# Patient Record
Sex: Female | Born: 1950 | Race: White | Hispanic: Yes | Marital: Married | Smoking: Never smoker
Health system: Southern US, Community
[De-identification: ages and names within clinical notes are randomized; demographics above are authoritative.]

## PROBLEM LIST (undated history)

## (undated) DIAGNOSIS — M1990 Unspecified osteoarthritis, unspecified site: Secondary | ICD-10-CM

## (undated) DIAGNOSIS — M8580 Other specified disorders of bone density and structure, unspecified site: Secondary | ICD-10-CM

## (undated) DIAGNOSIS — M810 Age-related osteoporosis without current pathological fracture: Secondary | ICD-10-CM

## (undated) DIAGNOSIS — L853 Xerosis cutis: Secondary | ICD-10-CM

## (undated) DIAGNOSIS — E559 Vitamin D deficiency, unspecified: Secondary | ICD-10-CM

## (undated) HISTORY — DX: Age-related osteoporosis without current pathological fracture: M81.0

## (undated) HISTORY — DX: Vitamin D deficiency, unspecified: E55.9

## (undated) HISTORY — DX: Xerosis cutis: L85.3

## (undated) HISTORY — DX: Unspecified osteoarthritis, unspecified site: M19.90

## (undated) HISTORY — DX: Other specified disorders of bone density and structure, unspecified site: M85.80

---

## 2001-07-12 ENCOUNTER — Other Ambulatory Visit: Admission: RE | Admit: 2001-07-12 | Discharge: 2001-07-12 | Payer: Self-pay | Admitting: *Deleted

## 2002-08-07 ENCOUNTER — Other Ambulatory Visit: Admission: RE | Admit: 2002-08-07 | Discharge: 2002-08-07 | Payer: Self-pay | Admitting: *Deleted

## 2004-08-27 ENCOUNTER — Ambulatory Visit (HOSPITAL_COMMUNITY): Admission: RE | Admit: 2004-08-27 | Discharge: 2004-08-27 | Payer: Self-pay | Admitting: Gastroenterology

## 2005-02-01 ENCOUNTER — Encounter: Admission: RE | Admit: 2005-02-01 | Discharge: 2005-02-01 | Payer: Self-pay | Admitting: General Surgery

## 2005-02-04 ENCOUNTER — Ambulatory Visit (HOSPITAL_BASED_OUTPATIENT_CLINIC_OR_DEPARTMENT_OTHER): Admission: RE | Admit: 2005-02-04 | Discharge: 2005-02-04 | Payer: Self-pay | Admitting: General Surgery

## 2005-02-04 ENCOUNTER — Ambulatory Visit (HOSPITAL_COMMUNITY): Admission: RE | Admit: 2005-02-04 | Discharge: 2005-02-04 | Payer: Self-pay | Admitting: General Surgery

## 2012-12-20 ENCOUNTER — Encounter: Payer: Self-pay | Admitting: *Deleted

## 2012-12-20 ENCOUNTER — Encounter: Payer: Self-pay | Admitting: Cardiovascular Disease

## 2012-12-21 ENCOUNTER — Ambulatory Visit (INDEPENDENT_AMBULATORY_CARE_PROVIDER_SITE_OTHER): Payer: Self-pay | Admitting: Cardiovascular Disease

## 2012-12-21 ENCOUNTER — Encounter: Payer: Self-pay | Admitting: Cardiovascular Disease

## 2012-12-21 VITALS — BP 130/80 | HR 62 | Ht 61.0 in | Wt 128.4 lb

## 2012-12-21 DIAGNOSIS — R079 Chest pain, unspecified: Secondary | ICD-10-CM

## 2012-12-21 NOTE — Addendum Note (Signed)
Addended by: Scherrie Bateman E on: 12/21/2012 10:35 AM   Modules accepted: Orders

## 2012-12-21 NOTE — Progress Notes (Signed)
Patient ID: Nicole Petty, female   DOB: 01-May-1951, 62 y.o.   MRN: [id number] 62 yo referred by Clement Sayres for chest pain  Monday had pain in chest At rest and worse in recumbancy.  Lasted until she fell asleep and lingered on awakening.  Has some palpitations with exertion.  Says she has heart aches with walking/running for 20 years.  Also gets sharp pain in chest out of blue.  No pleurisy.  No fever or recent trauma.  Took a baby aspirin on Monday when pain bad but hard to know if this helped.  No cough or sputum.  No history of arthritis, or muscular disease.    ROS: Denies fever, malais, weight loss, blurry vision, decreased visual acuity, cough, sputum, SOB, hemoptysis, pleuritic pain, palpitaitons, heartburn, abdominal pain, melena, lower extremity edema, claudication, or rash.  All other systems reviewed and negative   General: Affect appropriate Healthy:  appears stated age HEENT: normal Neck supple with no adenopathy JVP normal no bruits no thyromegaly Lungs clear with no wheezing and good diaphragmatic motion Heart:  S1/S2 no murmur,rub, gallop or click PMI normal Abdomen: benighn, BS positve, no tenderness, no AAA no bruit.  No HSM or HJR Distal pulses intact with no bruits No edema Neuro non-focal Skin warm and dry No muscular weakness  Medications Current Outpatient Prescriptions  Medication Sig Dispense Refill  . Boswellia-Glucosamine-Vit D (GLUCOSAMINE COMPLEX PO) Take by mouth daily.      Marland Kitchen CALCIUM PO Take 1 tablet by mouth daily.      . Cholecalciferol (VITAMIN D PO) Take by mouth.      . Cyanocobalamin (VITAMIN B 12 PO) Take by mouth daily.      . cycloSPORINE (RESTASIS) 0.05 % ophthalmic emulsion 1 drop 2 (two) times daily.      . Emollient (COLLAGEN EX) Apply topically.      . Multiple Vitamin (MULTIVITAMIN) capsule Take 1 capsule by mouth daily.      . Omega-3 Fatty Acids (FISH OIL PO) Take by mouth daily.      . Ranitidine HCl (ZANTAC PO) Take  by mouth daily.       No current facility-administered medications for this visit.    Allergies Review of patient's allergies indicates no known allergies.  Family History: Family History  Problem Relation Age of Onset  . Osteoarthritis      Social History: History   Social History  . Marital Status: Married    Spouse Name: N/A    Number of Children: N/A  . Years of Education: N/A   Occupational History  . Not on file.   Social History Main Topics  . Smoking status: Never Smoker   . Smokeless tobacco: Not on file  . Alcohol Use: Not on file  . Drug Use: Not on file  . Sexually Active: Not on file   Other Topics Concern  . Not on file   Social History Narrative  . No narrative on file    Electrocardiogram:  NSR rate 65 PAC nonspecific ST/T wave changes V1-3 T wave changes in V1-3 less prominent thant ECG 12/20/12  Assessment and Plan

## 2012-12-21 NOTE — Patient Instructions (Addendum)
Your physician recommends that you schedule a follow-up appointment in:   AS NEEDED   Your physician recommends that you continue on your current medications as directed. Please refer to the Current Medication list given to you today.   Your physician has requested that you have a stress echocardiogram. For further information please visit [email or web address]. Please follow instruction sheet as given.  

## 2012-12-21 NOTE — Assessment & Plan Note (Signed)
Atypical with normal exam.  ECG unrevealing.  F/U stress echo.  Echo portion will help r/o pericardial effusion

## 2012-12-27 ENCOUNTER — Encounter: Payer: Self-pay | Admitting: Cardiovascular Disease

## 2013-01-04 ENCOUNTER — Other Ambulatory Visit (HOSPITAL_COMMUNITY): Payer: 59

## 2013-02-01 ENCOUNTER — Ambulatory Visit (HOSPITAL_COMMUNITY): Payer: 59

## 2013-02-01 ENCOUNTER — Ambulatory Visit (HOSPITAL_COMMUNITY): Payer: 59 | Attending: Cardiovascular Disease

## 2013-02-01 ENCOUNTER — Encounter: Payer: Self-pay | Admitting: Cardiovascular Disease

## 2013-02-01 DIAGNOSIS — R072 Precordial pain: Secondary | ICD-10-CM

## 2013-02-01 DIAGNOSIS — R079 Chest pain, unspecified: Secondary | ICD-10-CM | POA: Insufficient documentation

## 2013-02-01 DIAGNOSIS — R0989 Other specified symptoms and signs involving the circulatory and respiratory systems: Secondary | ICD-10-CM

## 2013-02-01 DIAGNOSIS — R002 Palpitations: Secondary | ICD-10-CM | POA: Insufficient documentation

## 2013-02-01 NOTE — Progress Notes (Signed)
Echocardiogram performed.  

## 2013-02-07 ENCOUNTER — Telehealth: Payer: Self-pay | Admitting: Cardiovascular Disease

## 2013-02-07 NOTE — Telephone Encounter (Signed)
New problem   Pt is returning your call from yesterday please call at office # [phone]. Please call pt

## 2013-02-11 NOTE — Telephone Encounter (Signed)
LMTCB ./CY 

## 2013-02-12 NOTE — Telephone Encounter (Signed)
PT  AWARE OF STRESS ECHO RESULTS ./CY 

## 2013-02-12 NOTE — Telephone Encounter (Signed)
Follow Up    Pt calling in returning phone call from yesterday. Please call work number.

## 2014-06-18 ENCOUNTER — Other Ambulatory Visit (INDEPENDENT_AMBULATORY_CARE_PROVIDER_SITE_OTHER): Payer: Self-pay | Admitting: Otolaryngology

## 2014-06-18 DIAGNOSIS — H905 Unspecified sensorineural hearing loss: Secondary | ICD-10-CM

## 2014-06-23 ENCOUNTER — Other Ambulatory Visit: Payer: 59

## 2014-06-26 ENCOUNTER — Other Ambulatory Visit: Payer: 59

## 2014-07-03 ENCOUNTER — Telehealth: Payer: Self-pay | Admitting: Internal Medicine

## 2014-07-09 ENCOUNTER — Encounter: Payer: Self-pay | Admitting: Internal Medicine

## 2014-07-11 ENCOUNTER — Other Ambulatory Visit (INDEPENDENT_AMBULATORY_CARE_PROVIDER_SITE_OTHER): Payer: Self-pay | Admitting: Otolaryngology

## 2014-07-11 DIAGNOSIS — H905 Unspecified sensorineural hearing loss: Secondary | ICD-10-CM

## 2014-07-15 ENCOUNTER — Other Ambulatory Visit: Payer: 59

## 2014-07-19 ENCOUNTER — Other Ambulatory Visit: Payer: 59

## 2014-07-28 ENCOUNTER — Ambulatory Visit
Admission: RE | Admit: 2014-07-28 | Discharge: 2014-07-28 | Disposition: A | Discharge status: Home / Self Care | Payer: 59 | Source: Ambulatory Visit | Attending: Otolaryngology | Admitting: Otolaryngology

## 2014-07-28 DIAGNOSIS — H905 Unspecified sensorineural hearing loss: Secondary | ICD-10-CM

## 2014-07-28 MED ORDER — GADOBENATE DIMEGLUMINE 529 MG/ML IV SOLN: 11.0000 mL | Freq: Once | INTRAVENOUS | Status: AC | PRN

## 2014-07-29 NOTE — Telephone Encounter (Signed)
Appt Scheduled 09-05-14/yf

## 2014-09-05 ENCOUNTER — Encounter: Payer: 59 | Admitting: Internal Medicine

## 2014-11-10 ENCOUNTER — Encounter: Payer: Self-pay | Admitting: Podiatry

## 2014-11-10 ENCOUNTER — Ambulatory Visit (INDEPENDENT_AMBULATORY_CARE_PROVIDER_SITE_OTHER): Payer: 59 | Admitting: Podiatry

## 2014-11-10 ENCOUNTER — Ambulatory Visit (INDEPENDENT_AMBULATORY_CARE_PROVIDER_SITE_OTHER): Payer: 59

## 2014-11-10 VITALS — BP 122/67 | HR 67 | Resp 16

## 2014-11-10 DIAGNOSIS — M79672 Pain in left foot: Secondary | ICD-10-CM

## 2014-11-10 DIAGNOSIS — R609 Edema, unspecified: Secondary | ICD-10-CM

## 2014-11-10 DIAGNOSIS — M84375A Stress fracture, left foot, initial encounter for fracture: Secondary | ICD-10-CM

## 2014-11-10 NOTE — Progress Notes (Signed)
   Subjective:    Patient ID: Nicole Petty, female    DOB: 02/28/1951, 64 y.o.   MRN: [id number]  HPI Comments: "I have a swollen foot"  Patient c/o aching forefoot right for 2 weeks. The area is swollen and red. No injury. She has been using ice, heat and ibuprofen-no help.  Foot Pain      Review of Systems  Eyes: Positive for redness.  Hematological: Bruises/bleeds easily.  All other systems reviewed and are negative.      Objective:   Physical Exam        Assessment & Plan:

## 2014-11-12 NOTE — Progress Notes (Signed)
Subjective:     Patient ID: Nicole Petty, female   DOB: 10/10/1951, 64 y.o.   MRN: [id number]  HPI patient presents with a very painful right forefoot of 2 weeks duration stating it started right away and it makes it hard for her to walk. Feels like she's walking on a broken bone   Review of Systems  All other systems reviewed and are negative.      Objective:   Physical Exam  Constitutional: She is oriented to person, place, and time.  Cardiovascular: Intact distal pulses.   Musculoskeletal: Normal range of motion.  Neurological: She is oriented to person, place, and time.  Skin: Skin is warm.  Nursing note and vitals reviewed.  neurovascular status found to be intact with muscle strength adequate and range of motion within normal limits. Patient is noted to have forefoot edema right with pain around the third metatarsal shaft distal that's very sore when pressed with inability to bear full weight on this area     Assessment:     Probable stress fracture third metatarsal right    Plan:     H&P and x-ray reviewed. Applied Unna boot to reduce swelling and placed an air fracture walker to reduce all plantar pressures and reappoint again to re-x-ray in 3 weeks. Earlier if any issues occur

## 2014-12-01 ENCOUNTER — Ambulatory Visit: Payer: 59 | Admitting: Podiatry

## 2014-12-04 ENCOUNTER — Other Ambulatory Visit: Payer: Self-pay | Admitting: Podiatry

## 2014-12-04 ENCOUNTER — Ambulatory Visit (INDEPENDENT_AMBULATORY_CARE_PROVIDER_SITE_OTHER): Payer: 59

## 2014-12-04 ENCOUNTER — Ambulatory Visit (INDEPENDENT_AMBULATORY_CARE_PROVIDER_SITE_OTHER): Payer: 59 | Admitting: Podiatry

## 2014-12-04 ENCOUNTER — Encounter: Payer: Self-pay | Admitting: Podiatry

## 2014-12-04 VITALS — BP 124/60 | HR 66 | Resp 16

## 2014-12-04 DIAGNOSIS — S92301D Fracture of unspecified metatarsal bone(s), right foot, subsequent encounter for fracture with routine healing: Secondary | ICD-10-CM

## 2014-12-04 DIAGNOSIS — M84375A Stress fracture, left foot, initial encounter for fracture: Secondary | ICD-10-CM

## 2014-12-05 NOTE — Progress Notes (Signed)
Subjective:     Patient ID: Nicole Petty, female   DOB: 11/21/1950, 64 y.o.   MRN: [id number]  HPI patient states my left foot is slowly improving but it still quite sore and the boot has really helped   Review of Systems     Objective:   Physical Exam Neurovascular status intact no health history changes noted with continued discomfort on the left forefoot with pain that appears to call last more within the second metatarsal distal shaft with still edema noted and mild fluid buildup    Assessment:     Stress fracture probable left second metatarsal    Plan:     X-ray taken reviewed and advised on continued boot usage ice therapy and gradual return soft shoes

## 2016-07-28 NOTE — Progress Notes (Signed)
Cardiology Office Note    Date:  07/29/2016   ID:  Nicole Petty, Nicole Petty [birth year], MRN [id number]  PCP:  Tula Nakayama  Cardiologist:  New (Dr. Harrington Challenger)  Chief Complaint: Chest pain   History of Present Illness:   Nicole Petty is a 65 y.o. female who presented for chest pain and sob evaluation.   Previously seen by Dr. Johnsie Cancel once 12/2012 for chest pain. It felt atypical. Stress echo 02/12/12 was normal with normal LV function. No WM abnormality.   The patient was seen by PCP 07/26/16. Complain CP that worsening with exertion and referred to cardiology. Other complains include dizziness and increased bowel frequency. EKG shows sinus rhythm at rate of 59 bpm and TWI in leads V3 and V4 which appears new compared to EKG of 12/26/12. Started on Flonase for possible vertigo. States no improvement.   Lab work by PCP:  CMP: K 4.5 Na 144 BUN 21  Scr 0.8  Total bilirubin 0.4 Alk phos 81 AST 39 ALT 52 GFR > 60 CBC: WBC 4 RBC 4.53 Hgb 14.3  Hematocrit 42.3 TSH: Normal   Here for further evaluation. She went to [facility] September 17-20th for vacation. She flew. She had noted exertional left upper chest pain. Describes as dully stabbing. The pain resolved with rest in few minutes. No SOB, nausea, vomiting, diaphoresis or radiation of pain. No she has noted chest pain with minimal exertion. Denies orthopnea, PND, syncope, LE edema, fever, chills, palpitation.   Denies tobacco or illicit drug abuse.   Past Medical History:  Diagnosis Date  . Osteoarthritis   . Osteopenia   . Vitamin D deficiency   . Xerosis cutis     History reviewed. No pertinent surgical history.  Current Medications: Prior to Admission medications   Medication Sig Start Date End Date Taking? Authorizing Provider  Boswellia-Glucosamine-Vit D (GLUCOSAMINE COMPLEX PO) Take by mouth daily.    Historical Provider, MD  CALCIUM PO Take 1 tablet by mouth daily.    Historical Provider, MD  Cholecalciferol (VITAMIN D PO) Take by  mouth.    Historical Provider, MD  Cyanocobalamin (VITAMIN B 12 PO) Take by mouth daily.    Historical Provider, MD  cycloSPORINE (RESTASIS) 0.05 % ophthalmic emulsion 1 drop 2 (two) times daily.    Historical Provider, MD  Emollient (COLLAGEN EX) Apply topically.    Historical Provider, MD  ibuprofen (ADVIL,MOTRIN) 200 MG tablet Take 200 mg by mouth every 6 (six) hours as needed.    Historical Provider, MD  Multiple Vitamin (MULTIVITAMIN) capsule Take 1 capsule by mouth daily.    Historical Provider, MD  Omega-3 Fatty Acids (FISH OIL PO) Take by mouth daily.    Historical Provider, MD  Ranitidine HCl (ZANTAC PO) Take by mouth daily.    Historical Provider, MD    Allergies:   Review of patient's allergies indicates no known allergies.   Social History   Social History  . Marital status: Married    Spouse name: N/A  . Number of children: N/A  . Years of education: N/A   Social History Main Topics  . Smoking status: Never Smoker  . Smokeless tobacco: Never Used  . Alcohol use None  . Drug use: Unknown  . Sexual activity: Not Asked   Other Topics Concern  . None   Social History Narrative  . None     Family History:  Denies family hx of cardiac disease  ROS:   Please see the history of present illness.  ROS All other systems reviewed and are negative.   PHYSICAL EXAM:   VS:  BP 94/60   Pulse 76   Ht '5\' 1"'$  (1.549 m)   Wt 129 lb 12.8 oz (58.9 kg)   SpO2 95%   BMI 24.53 kg/m    GEN: Well nourished, well developed, in no acute distress  HEENT: normal  Neck: no JVD, carotid bruits, or masses Cardiac: RRR; no murmurs, rubs, or gallops,no edema  Respiratory:  clear to auscultation bilaterally, normal work of breathing GI: soft, nontender, nondistended, + BS MS: no deformity or atrophy  Skin: warm and dry, no rash Neuro:  Alert and Oriented x 3, Strength and sensation are intact Psych: euthymic mood, full affect  Wt Readings from Last 3 Encounters:  07/29/16 129 lb  12.8 oz (58.9 kg)  12/21/12 128 lb 6.4 oz (58.2 kg)      Studies/Labs Reviewed:   EKG:  EKG is not ordered today.    Recent Labs: As noted above  Lipid Panel No results found for: CHOL, TRIG, HDL, CHOLHDL, VLDL, LDLCALC, LDLDIRECT  Additional studies/ records that were reviewed today include:   As above  ASSESSMENT & PLAN:    1. Exertional left upper chest pain - Now occurs with minimal exertion. EKG from PCP showed new TWI in lead V3 and V4 compared to EKG of 12/21/12 in [facility]. No other associated symptoms. Will get stress echo. Daily ASA '81mg'$ . Limit activity.  2. Vertigo - Per primary  3. Low blood pressure - BP of 94/60 with HR of 76 today. BP of 120/72 with HR of 67 at PCP office. Advised to keep hydrated. Recently has frequent loose bowel. Not on any antihypertensive.    Medication Adjustments/Labs and Tests Ordered: Current medicines are reviewed at length with the patient today.  Concerns regarding medicines are outlined above.  Medication changes, Labs and Tests ordered today are listed in the Patient Instructions below. Patient Instructions  Medication Instructions:  Your physician has recommended you make the following change in your medication:  1.  START Aspirin 81 mg take 1 tablet daily    Labwork: None ordered  Testing/Procedures: Your physician has requested that you have a stress echocardiogram. For further information please visit [email or web address]. Please follow instruction sheet as given.   Follow-Up: Your physician recommends that you schedule a follow-up appointment in: 3 MONTHS WITH DR. ROSS   Any Other Special Instructions Will Be Listed Below (If Applicable).  Exercise Stress Echocardiogram An exercise stress echocardiogram is a heart (cardiac) test used to check the function of your heart. This test may also be called an exercise stress echocardiography or stress echo. This stress test will check how well your heart muscle and valves are  working and determine if your heart muscle is getting enough blood. You will exercise on a treadmill to naturally increase or stress the functioning of your heart.  An echocardiogram uses sound waves (ultrasound) to produce an image of your heart. If your heart does not work normally, it may indicate coronary artery disease with poor coronary blood supply. The coronary arteries are the arteries that bring blood and oxygen to your heart. LET [facility] CARE PROVIDER KNOW ABOUT:  Any allergies you have.  All medicines you are taking, including vitamins, herbs, eye drops, creams, and over-the-counter medicines.  Previous problems you or members of your family have had with the use of anesthetics.  Any blood disorders you have.  Previous surgeries you have had.  Medical conditions  you have.  Possibility of pregnancy, if this applies. RISKS AND COMPLICATIONS Generally, this is a safe procedure. However, as with any procedure, complications can occur. Possible complications can include:  You develop pain or pressure in the following areas:  Chest.  Jaw or neck.  Between your shoulder blades.  Radiating down your left arm.  Dizziness or lightheadedness.  Shortness of breath.  Increased or irregular heartbeat.  Nausea or vomiting.  Heart attack (rare). BEFORE THE PROCEDURE  Avoid all forms of caffeine for 24 hours before your test or as directed by your health care provider. This includes coffee, tea (even decaffeinated tea), caffeinated sodas, chocolate, cocoa, and certain pain medicines.  Follow your health care provider's instructions regarding eating and drinking before the test.  Take your medicines as directed at regular times with water unless instructed otherwise. Exceptions may include:  If you have diabetes, ask how you are to take your insulin or pills. It is common to adjust insulin dosing the morning of the test.  If you are taking beta-blocker medicines, it is  important to talk to your health care provider about these medicines well before the date of your test. Taking beta-blocker medicines may interfere with the test. In some cases, these medicines need to be changed or stopped 24 hours or more before the test.  If you wear a nitroglycerin patch, it may need to be removed prior to the test. Ask your health care provider if the patch should be removed before the test.  If you use an inhaler for any breathing condition, bring it with you to the test.  If you are an outpatient, bring a snack so you can eat right after the stress phase of the test.  Do not smoke for 4 hours prior to the test or as directed by your health care provider.  Wear loose-fitting clothes and comfortable shoes for the test. This test involves walking on a treadmill. PROCEDURE   Multiple electrodes will be put on your chest. If needed, small areas of your chest may be shaved to get better contact with the electrodes. Once the electrodes are attached to your body, multiple wires will be attached to the electrodes, and your heart rate will be monitored.  You will have an echocardiogram done at rest.  To produce this image of your heart, gel is applied to your chest, and a wand-like tool (transducer) is moved over the chest. The transducer sends the sound waves through the chest to create the moving images of your heart.  You may need an IV to receive a medication that improves the quality of the pictures.  You will then walk on a treadmill. The treadmill will be started at a slow pace. The treadmill speed and incline will gradually be increased to raise your heart rate.  At the peak of exercise, the treadmill will be stopped. You will lie down immediately on a bed so that a second echocardiogram can be done to visualize your heart's motion with exercise.  The test usually takes 30-60 minutes to complete. AFTER THE PROCEDURE  Your heart rate and blood pressure will be  monitored after the test.  You may return to your normal schedule, including diet, activities, and medicines, unless your health care provider tells you otherwise.   This information is not intended to replace advice given to you by your health care provider. Make sure you discuss any questions you have with your health care provider.   Document Released: 10/21/2004 Document  Revised: 10/22/2013 Document Reviewed: 06/24/2013 Elsevier Interactive Patient Education [employer].    If you need a refill on your cardiac medications before your next appointment, please call your pharmacy.      Jarrett Soho, [place]  07/29/2016 8:46 AM    [facility] [address], [place], [place]  [address] Phone: [phone]; Fax: [phone]

## 2016-07-29 ENCOUNTER — Encounter (INDEPENDENT_AMBULATORY_CARE_PROVIDER_SITE_OTHER): Payer: Self-pay

## 2016-07-29 ENCOUNTER — Encounter: Payer: Self-pay | Admitting: Physician Assistant

## 2016-07-29 ENCOUNTER — Ambulatory Visit (INDEPENDENT_AMBULATORY_CARE_PROVIDER_SITE_OTHER): Payer: 59 | Admitting: Physician Assistant

## 2016-07-29 VITALS — BP 94/60 | HR 76 | Ht 61.0 in | Wt 129.8 lb

## 2016-07-29 DIAGNOSIS — I959 Hypotension, unspecified: Secondary | ICD-10-CM | POA: Diagnosis not present

## 2016-07-29 DIAGNOSIS — R079 Chest pain, unspecified: Secondary | ICD-10-CM

## 2016-07-29 DIAGNOSIS — R42 Dizziness and giddiness: Secondary | ICD-10-CM

## 2016-07-29 MED ORDER — ASPIRIN EC 81 MG PO TBEC: 81.0000 mg | DELAYED_RELEASE_TABLET | Freq: Every day | ORAL | 3 refills | Status: DC

## 2016-07-29 NOTE — Patient Instructions (Signed)
Medication Instructions:  Your physician has recommended you make the following change in your medication:  1.  START Aspirin 81 mg take 1 tablet daily    Labwork: None ordered  Testing/Procedures: Your physician has requested that you have a stress echocardiogram. For further information please visit [email or web address] Please follow instruction sheet as given.   Follow-Up: Your physician recommends that you schedule a follow-up appointment in: 3 MONTHS WITH DR. ROSS   Any Other Special Instructions Will Be Listed Below (If Applicable).  Exercise Stress Echocardiogram An exercise stress echocardiogram is a heart (cardiac) test used to check the function of your heart. This test may also be called an exercise stress echocardiography or stress echo. This stress test will check how well your heart muscle and valves are working and determine if your heart muscle is getting enough blood. You will exercise on a treadmill to naturally increase or stress the functioning of your heart.  An echocardiogram uses sound waves (ultrasound) to produce an image of your heart. If your heart does not work normally, it may indicate coronary artery disease with poor coronary blood supply. The coronary arteries are the arteries that bring blood and oxygen to your heart. LET [facility] CARE PROVIDER KNOW ABOUT:  Any allergies you have.  All medicines you are taking, including vitamins, herbs, eye drops, creams, and over-the-counter medicines.  Previous problems you or members of your family have had with the use of anesthetics.  Any blood disorders you have.  Previous surgeries you have had.  Medical conditions you have.  Possibility of pregnancy, if this applies. RISKS AND COMPLICATIONS Generally, this is a safe procedure. However, as with any procedure, complications can occur. Possible complications can include:  You develop pain or pressure in the following areas:  Chest.  Jaw or  neck.  Between your shoulder blades.  Radiating down your left arm.  Dizziness or lightheadedness.  Shortness of breath.  Increased or irregular heartbeat.  Nausea or vomiting.  Heart attack (rare). BEFORE THE PROCEDURE  Avoid all forms of caffeine for 24 hours before your test or as directed by your health care provider. This includes coffee, tea (even decaffeinated tea), caffeinated sodas, chocolate, cocoa, and certain pain medicines.  Follow your health care provider's instructions regarding eating and drinking before the test.  Take your medicines as directed at regular times with water unless instructed otherwise. Exceptions may include:  If you have diabetes, ask how you are to take your insulin or pills. It is common to adjust insulin dosing the morning of the test.  If you are taking beta-blocker medicines, it is important to talk to your health care provider about these medicines well before the date of your test. Taking beta-blocker medicines may interfere with the test. In some cases, these medicines need to be changed or stopped 24 hours or more before the test.  If you wear a nitroglycerin patch, it may need to be removed prior to the test. Ask your health care provider if the patch should be removed before the test.  If you use an inhaler for any breathing condition, bring it with you to the test.  If you are an outpatient, bring a snack so you can eat right after the stress phase of the test.  Do not smoke for 4 hours prior to the test or as directed by your health care provider.  Wear loose-fitting clothes and comfortable shoes for the test. This test involves walking on a treadmill. PROCEDURE  Multiple electrodes will be put on your chest. If needed, small areas of your chest may be shaved to get better contact with the electrodes. Once the electrodes are attached to your body, multiple wires will be attached to the electrodes, and your heart rate will be  monitored.  You will have an echocardiogram done at rest.  To produce this image of your heart, gel is applied to your chest, and a wand-like tool (transducer) is moved over the chest. The transducer sends the sound waves through the chest to create the moving images of your heart.  You may need an IV to receive a medication that improves the quality of the pictures.  You will then walk on a treadmill. The treadmill will be started at a slow pace. The treadmill speed and incline will gradually be increased to raise your heart rate.  At the peak of exercise, the treadmill will be stopped. You will lie down immediately on a bed so that a second echocardiogram can be done to visualize your heart's motion with exercise.  The test usually takes 30-60 minutes to complete. AFTER THE PROCEDURE  Your heart rate and blood pressure will be monitored after the test.  You may return to your normal schedule, including diet, activities, and medicines, unless your health care provider tells you otherwise.   This information is not intended to replace advice given to you by your health care provider. Make sure you discuss any questions you have with your health care provider.   Document Released: 10/21/2004 Document Revised: 10/22/2013 Document Reviewed: 06/24/2013 Elsevier Interactive Patient Education [employer] Elsevier Inc.    If you need a refill on your cardiac medications before your next appointment, please call your pharmacy.

## 2016-08-26 ENCOUNTER — Other Ambulatory Visit (HOSPITAL_COMMUNITY): Payer: 59

## 2016-09-01 ENCOUNTER — Telehealth (HOSPITAL_COMMUNITY): Payer: Self-pay | Admitting: *Deleted

## 2016-09-01 NOTE — Telephone Encounter (Signed)
Left message on voicemail per DPR in reference to upcoming appointment scheduled on 09/05/16 at 7:30 with detailed instructions given per Stress Test Requisition Sheet for the test. LM to arrive 30 minutes early, and that it is imperative to arrive on time for appointment to keep from having the test rescheduled. If you need to cancel or reschedule your appointment, please call the office within 24 hours of your appointment. Failure to do so may result in a cancellation of your appointment, and a $50 no show fee. Phone number given for call back for any questions. Nicole DolinSharon S Petty

## 2016-09-05 ENCOUNTER — Ambulatory Visit (HOSPITAL_COMMUNITY): Payer: 59

## 2016-09-05 ENCOUNTER — Ambulatory Visit (HOSPITAL_COMMUNITY): Payer: 59 | Attending: Cardiovascular Disease

## 2016-09-05 ENCOUNTER — Encounter (INDEPENDENT_AMBULATORY_CARE_PROVIDER_SITE_OTHER): Payer: Self-pay

## 2016-09-05 DIAGNOSIS — R079 Chest pain, unspecified: Secondary | ICD-10-CM | POA: Insufficient documentation

## 2016-09-06 ENCOUNTER — Telehealth: Payer: Self-pay | Admitting: Internal Medicine

## 2016-09-06 NOTE — Telephone Encounter (Signed)
-----   Message from ScarsdaleBhavinkumar Bhagat, [place] sent at 09/05/2016 11:17 AM EST ----- Normal study. F/u with Dr. Tenny Crawoss as schedule.

## 2016-09-06 NOTE — Telephone Encounter (Signed)
Follow Up:; ° ° °Returning your call. °

## 2016-09-06 NOTE — Telephone Encounter (Signed)
Returned pts call and went over her echo results. Pt verbalized understanding. 

## 2016-09-29 DIAGNOSIS — M810 Age-related osteoporosis without current pathological fracture: Secondary | ICD-10-CM | POA: Insufficient documentation

## 2016-11-25 ENCOUNTER — Encounter (INDEPENDENT_AMBULATORY_CARE_PROVIDER_SITE_OTHER): Payer: Self-pay

## 2016-11-25 ENCOUNTER — Encounter: Payer: Self-pay | Admitting: Internal Medicine

## 2016-11-25 ENCOUNTER — Ambulatory Visit (INDEPENDENT_AMBULATORY_CARE_PROVIDER_SITE_OTHER): Payer: 59 | Admitting: Internal Medicine

## 2016-11-25 VITALS — BP 118/60 | HR 77 | Ht 61.0 in | Wt 130.0 lb

## 2016-11-25 DIAGNOSIS — R079 Chest pain, unspecified: Secondary | ICD-10-CM | POA: Diagnosis not present

## 2016-11-25 NOTE — Progress Notes (Signed)
Cardiology Office Note   Date:  11/25/2016   ID:  Nicole Petty, DOB 05/22/1951, MRN [id number]  PCP:  Lilia ArgueKAPLAN,KRISTEN, PA-C  Cardiologist:   Dietrich PatesPaula Brecken Dewoody, MD    F/U of CP     History of Present Illness: Nicole Petty is a 66 y.o. female with a history of Atypical CP  She was seen by Burna FortsP Nishan in the past  Alos seen by V Bhagat in Novmeber   Pt had stress echo in Novmeber 2017  Walked 9 min 30 sec  EKG negative  Echo normal    Since seen she has had not CP  She walks regularly Lipids done by primary  "good cholesterol high, bad cholesterol low"   Current Meds  Medication Sig  . Boswellia-Glucosamine-Vit D (GLUCOSAMINE COMPLEX PO) Take 1 tablet by mouth daily.   Marland Kitchen. CALCIUM PO Take 1 tablet by mouth daily.  . Cholecalciferol (VITAMIN D PO) Take 1 tablet by mouth daily.   . Cyanocobalamin (VITAMIN B 12 PO) Take 1 tablet by mouth daily.   . cycloSPORINE (RESTASIS) 0.05 % ophthalmic emulsion Place 1 drop into both eyes 2 (two) times daily.   . Emollient (COLLAGEN EX) Apply 1 application topically as needed.   Marland Kitchen. ibuprofen (ADVIL,MOTRIN) 200 MG tablet Take 200 mg by mouth every 6 (six) hours as needed for mild pain or moderate pain.   . Omega-3 Fatty Acids (FISH OIL PO) Take 1 tablet by mouth daily.      Allergies:   Patient has no known allergies.   Past Medical History:  Diagnosis Date  . Osteoarthritis   . Osteopenia   . Vitamin D deficiency   . Xerosis cutis     History reviewed. No pertinent surgical history.   Social History:  The patient  reports that she has never smoked. She has never used smokeless tobacco.   Family History:  The patient's family history includes Hypertension in her father and mother.    ROS:  Please see the history of present illness. All other systems are reviewed and  Negative to the above problem except as noted.    PHYSICAL EXAM: VS:  BP 118/60   Pulse 77   Ht 5\' 1"  (1.549 m)   Wt 130 lb (59 kg)   SpO2 98%   BMI 24.56 kg/m   GEN:  Well nourished, well developed, in no acute distress  HEENT: normal  Neck: no JVD, carotid bruits, or masses Cardiac: RRR; no murmurs, rubs, or gallops,no edema  Respiratory:  clear to auscultation bilaterally, normal work of breathing GI: soft, nontender, nondistended, + BS  No hepatomegaly  MS: no deformity Moving all extremities   Skin: warm and dry, no rash Neuro:  Strength and sensation are intact Psych: euthymic mood, full affect   EKG:  EKG is not ordered today.   Lipid Panel No results found for: CHOL, TRIG, HDL, CHOLHDL, VLDL, LDLCALC, LDLDIRECT    Wt Readings from Last 3 Encounters:  11/25/16 130 lb (59 kg)  07/29/16 129 lb 12.8 oz (58.9 kg)  12/21/12 128 lb 6.4 oz (58.2 kg)      ASSESSMENT AND PLAN:  Chest pain  Atypical for cardiac pain  Sharp  No recurrence   Will be available as needed Stay active     Current medicines are reviewed at length with the patient today.  The patient does not have concerns regarding medicines.  Signed, Dietrich PatesPaula Gennell How, MD  11/25/2016 2:01 PM    Nicole Petty  [facility] [place], [place], [place]  [address] Phone: [phone]; Fax: [phone]

## 2016-11-25 NOTE — Patient Instructions (Signed)
Your physician recommends that you continue on your current medications as directed. Please refer to the Current Medication list given to you today. Your physician recommends that you schedule a follow-up appointment as needed with Dr. Ross.   

## 2016-11-30 ENCOUNTER — Encounter: Payer: Self-pay | Admitting: Podiatry

## 2016-11-30 ENCOUNTER — Ambulatory Visit (INDEPENDENT_AMBULATORY_CARE_PROVIDER_SITE_OTHER): Payer: 59 | Admitting: Podiatry

## 2016-11-30 ENCOUNTER — Ambulatory Visit (INDEPENDENT_AMBULATORY_CARE_PROVIDER_SITE_OTHER): Payer: 59

## 2016-11-30 VITALS — BP 125/75 | HR 71 | Resp 16

## 2016-11-30 DIAGNOSIS — M722 Plantar fascial fibromatosis: Secondary | ICD-10-CM

## 2016-11-30 MED ORDER — TRIAMCINOLONE ACETONIDE 10 MG/ML IJ SUSP
10.0000 mg | Freq: Once | INTRAMUSCULAR | Status: AC
Start: 2016-11-30 — End: 2016-11-30
  Administered 2016-11-30: 10 mg

## 2016-11-30 NOTE — Progress Notes (Signed)
Subjective:     Patient ID: Nicole Petty, female   DOB: 09/04/1951, 66 y.o.   MRN: [id number]  HPI patient presents with pain in the right heel stating that it has been bothering her for several months and gradually getting worse and worse when she gets up in the morning or after periods of sitting   Review of Systems     Objective:   Physical Exam Neurovascular status intact muscle strength adequate with exquisite discomfort plantar aspect right heel at the insertional point tendon into the calcaneus with inflammation fluid around the medial band    Assessment:     Acute plantar fasciitis right with inflammation fluid    Plan:     H&P x-rays reviewed and injected the plantar fascia today 3 mg Kenalog 5 mill grams Xylocaine and applied fascial brace. Gave instructions on physical therapy shoe gear modifications and reappoint to recheck  X-ray report indicate spur formation no indication stress fracture arthritis

## 2016-11-30 NOTE — Patient Instructions (Signed)

## 2016-12-14 ENCOUNTER — Ambulatory Visit (INDEPENDENT_AMBULATORY_CARE_PROVIDER_SITE_OTHER): Payer: 59 | Admitting: Podiatry

## 2016-12-14 ENCOUNTER — Encounter: Payer: Self-pay | Admitting: Podiatry

## 2016-12-14 DIAGNOSIS — M722 Plantar fascial fibromatosis: Secondary | ICD-10-CM

## 2016-12-15 NOTE — Progress Notes (Signed)
Subjective:     Patient ID: Nicole Petty, female   DOB: 03/14/1951, 66 y.o.   MRN: [id number]  HPI patient presents stating that she is doing a lot better with mild discomfort in the plantar heel   Review of Systems     Objective:   Physical Exam Neurovascular status intact negative Homan sign was noted with patient found to have significant improvement plantar heel right at the insertional point tendon calcaneus    Assessment:     Plantar fasciitis right with significant improvement    Plan:     Reviewed condition and discussed physical therapy and supportive shoe gears and patient be seen back on an as-needed basis and may ultimately require orthotics or further treatment

## 2017-03-13 ENCOUNTER — Encounter: Payer: Self-pay | Admitting: Podiatry

## 2017-03-13 ENCOUNTER — Ambulatory Visit (INDEPENDENT_AMBULATORY_CARE_PROVIDER_SITE_OTHER): Payer: 59 | Admitting: Podiatry

## 2017-03-13 DIAGNOSIS — M722 Plantar fascial fibromatosis: Secondary | ICD-10-CM

## 2017-03-13 DIAGNOSIS — Q828 Other specified congenital malformations of skin: Secondary | ICD-10-CM

## 2017-03-13 MED ORDER — TRIAMCINOLONE ACETONIDE 10 MG/ML IJ SUSP
10.0000 mg | Freq: Once | INTRAMUSCULAR | Status: AC
  Administered 2017-03-13: 10 mg

## 2017-03-14 NOTE — Progress Notes (Signed)
Subjective:    Patient ID: Nicole Petty, female   DOB: 66 y.o.   MRN: [id number]   HPI patient states she still has a lot of pain under her right foot especially with activity and she's had a lesion on her left fifth metatarsal. States the pain is worse when she gets up in the morning or after sitting    ROS      Objective:  Physical Exam neurovascular status intact muscle strength adequate with patient found to have quite a bit of discomfort under the right plantar fascial insertion tendon calcaneus with the left showing a keratotic lesion underneath the metatarsal     Assessment:    Acute plantar fasciitis right with inflammation fluid especially after periods of sitting or morning and also noted to have porokeratotic lesion left     Plan:    H&P and conditions reviewed. Today I went ahead and injected the plantar fascia right 3 mg Kenalog 5 mg Xylocaine and dispensed a night splint with all instructions on usage. Patient will begin aggressive ice therapy and I debrided lesion left and will be seen back to recheck in 3 weeks

## 2018-05-29 ENCOUNTER — Ambulatory Visit
Admission: RE | Admit: 2018-05-29 | Discharge: 2018-05-29 | Disposition: A | Discharge status: Home / Self Care | Payer: Medicare Other | Source: Ambulatory Visit | Attending: Family Medicine | Admitting: Family Medicine

## 2018-05-29 ENCOUNTER — Other Ambulatory Visit: Payer: Self-pay | Admitting: Family Medicine

## 2018-05-29 DIAGNOSIS — M79644 Pain in right finger(s): Secondary | ICD-10-CM

## 2018-05-29 DIAGNOSIS — M79645 Pain in left finger(s): Secondary | ICD-10-CM

## 2018-09-10 LAB — HM MAMMOGRAPHY

## 2018-09-10 LAB — HM DEXA SCAN

## 2018-11-27 ENCOUNTER — Ambulatory Visit (INDEPENDENT_AMBULATORY_CARE_PROVIDER_SITE_OTHER): Payer: Medicare Other | Admitting: Family Medicine

## 2018-11-27 ENCOUNTER — Ambulatory Visit: Payer: Medicare Other | Admitting: Family Medicine

## 2018-11-27 ENCOUNTER — Encounter: Payer: Self-pay | Admitting: Family Medicine

## 2018-11-27 ENCOUNTER — Ambulatory Visit (INDEPENDENT_AMBULATORY_CARE_PROVIDER_SITE_OTHER): Payer: Medicare Other

## 2018-11-27 VITALS — BP 118/76 | HR 76 | Temp 98.2°F | Resp 14 | Ht 61.0 in | Wt 132.0 lb

## 2018-11-27 DIAGNOSIS — M19041 Primary osteoarthritis, right hand: Secondary | ICD-10-CM | POA: Diagnosis not present

## 2018-11-27 DIAGNOSIS — M79641 Pain in right hand: Secondary | ICD-10-CM | POA: Diagnosis not present

## 2018-11-27 DIAGNOSIS — M19042 Primary osteoarthritis, left hand: Secondary | ICD-10-CM

## 2018-11-27 DIAGNOSIS — M810 Age-related osteoporosis without current pathological fracture: Secondary | ICD-10-CM

## 2018-11-27 MED ORDER — PNEUMOCOCCAL 13-VAL CONJ VACC IM SUSP: 0.5000 mL | Freq: Once | INTRAMUSCULAR | 0 refills | Status: AC

## 2018-11-27 NOTE — Progress Notes (Signed)
Please call patient: I have reviewed his/her lab results. Nl xray. Suspect bone bruising. Reassure. Give more time to improve.

## 2018-11-27 NOTE — Patient Instructions (Signed)
Please return for your annual complete physical with pap smear; please come fasting. Can also schedule an AWV.  Medicare recommends an Annual Wellness Visit for all patients. Please schedule this to be done with our Nurse Educator, Maudie Mercury. This is an informative "talk" visit; it's goals are to ensure that your health care needs are being met and to give you education regarding avoiding falls, ensuring you are not suffering from depression or problems with memory or thinking, and to educate you on Advance Care Planning. It helps me take good care of you!  Please go the pharmacy with the Prevnar prescription to get your pneumonia vaccination.   Please go to our [facility] office to get your xrays done. You can walk in M-F between 8am and 5pm. Tell them you are there for xrays ordered by me. They will send me the results, then I will let you know the results with instructions.   Address: [place], [place], [place]  (office sits at [address] at [address] intersection; from here, turn left onto [place] [address]), take to [address], turn right and go for a mile or so, office will be on left across form [employer] )   It was a pleasure meeting you today! Thank you for choosing [place] to meet your healthcare needs! I truly look forward to working with you. If you have any questions or concerns, please send me a message via Mychart or call the office at [phone].

## 2018-11-27 NOTE — Progress Notes (Signed)
Subjective  CC:  Chief Complaint  Patient presents with  . Establish Care    Previous pt of Nicole Petty, last CPE was 08/2017  . Fall    She reports falling on 11/08/18, went to UC. Reports lip, right hand/wrist injury, and right knee pain    HPI: Nicole Petty is a 68 y.o. female who presents to [facility] today to establish care with me as a new patient.   very pleasant married 68 yo female former pt at [facility] x 30 years. I reviewed recent records from careeverywhere.  HM: last pap > 10 years ago. Reports had mammo and Dexa at [facility] last year - will request records. Due pneumonia vaccinations and CRC screen. Had colonoscopy in 2005 at age 68 with Nicole Petty - had either hemorrhoids or polyps - she is not sure. Has never had another one. Would prefer NOT to do it again due to perceived risk. No FH of colon cancer.  No h/o diabetes or HTN or HLD.  She has the following concerns or needs:  Date of injury 11/08/2018: fell in parking lot onto concrete on outstretched hands and knees hitting face on pavement. I reviewed note from Urgent care. Had abrasions and contusions and negative xrays. But still with lateral right hand pain/soreness making it difficult to write or "shake hands". No wrist pain. No swelling or bruising. Legs are ok.   Osteoporosis on boniva x 1-2 years with reported improvement on most recent bone density. Had been on fosamax for 5-10 years 5 years prior to that. No h/o fractures.   Due for CPE.   Assessment  1. Right hand pain   2. Primary osteoarthritis of both hands   3. Osteoporosis without current pathological fracture, unspecified osteoporosis type      Plan   Check xray of hand given localized persistent 5th metacarpal ttp. Exam is otherwise normal.   Will call for dexa and mammo reports and colonoscopy results.   Return for cpe with pap. Discussed need for pap smear, CRC screen of some type and gave RX for prevnar  today.   Follow up:  Return in about 4 weeks (around 12/25/2018) for complete physical, AWV. Orders Placed This Encounter  Procedures  . DG Hand Complete Right   Meds ordered this encounter  Medications  . pneumococcal 13-valent conjugate vaccine (PREVNAR 13) SUSP injection    Sig: Inject 0.5 mLs into the muscle once for 1 dose.    Dispense:  0.5 mL    Refill:  0     [facility]2018  Decreased Interest 0  Down, Depressed, Hopeless 0  PHQ - 2 Score 0    We updated and reviewed the patient's past history in detail and it is documented below.  Patient Active Problem List   Diagnosis Date Noted  . Osteoarthritis of both hands 11/27/2018    1st mcps   . Osteoporosis 09/29/2016    Dexa 07/2018 [facility], pt reports improvement on Boniva since 2017, had been on fosamax in the distant past.     Health Maintenance  Topic Date Due  . DEXA SCAN  1951-07-07  . Hepatitis C Screening  1951-07-07  . MAMMOGRAM  11/06/1970  . TETANUS/TDAP  11/07/1971  . COLONOSCOPY  08/27/2014  . PNA vac Low Risk Adult (1 of 2 - PCV13) 11/06/2017  . INFLUENZA VACCINE  Completed   Immunization History  Administered Date(s) Administered  . Influenza-Unspecified 11/06/2018  .  Tdap 08/29/2007  . Zoster 04/08/2013  . Zoster Recombinat (Shingrix) 03/06/2018, 06/18/2018   Current Meds  Medication Sig  . b complex vitamins capsule Take by mouth.  . Boswellia-Glucosamine-Vit D (GLUCOSAMINE COMPLEX PO) Take 2 tablets by mouth daily.   Marland Kitchen. CALCIUM PO Take 1 tablet by mouth daily.  . Calcium-Vitamin D 500-125 MG-UNIT TABS Take by mouth.  . Cholecalciferol (VITAMIN D PO) Take 1 tablet by mouth daily.   . Cyanocobalamin (VITAMIN B 12 PO) Take 1 tablet by mouth daily.   . cycloSPORINE (RESTASIS) 0.05 % ophthalmic emulsion Place 1 drop into both eyes 2 (two) times daily.   . Emollient (COLLAGEN EX) Apply 1 application topically as needed (SKIN).   Marland Kitchen. glucosamine-chondroitin 500-400 MG tablet Take  by mouth.  . ibandronate (BONIVA) 150 MG tablet TAKE 1 TABLET BY MOUTH  EVERY MONTH  . ibuprofen (ADVIL,MOTRIN) 200 MG tablet Take 200 mg by mouth every 6 (six) hours as needed for mild pain or moderate pain.   . Omega-3 Fatty Acids (FISH OIL PO) Take 1 tablet by mouth daily.     Allergies: Patient has No Known Allergies. Past Medical History Patient  has a past medical history of Osteoarthritis, Osteopenia, Vitamin D deficiency, and Xerosis cutis. Past Surgical History Patient  has no past surgical history on file. Family History: Patient family history includes Hypertension in her father and mother; Osteoarthritis in her unknown relative. Social History:  Patient  reports that she has never smoked. She has never used smokeless tobacco.  Review of Systems: Constitutional: negative for fever or malaise Ophthalmic: negative for photophobia, double vision or loss of vision Cardiovascular: negative for chest pain, dyspnea on exertion, or new LE swelling Respiratory: negative for SOB or persistent cough Gastrointestinal: negative for abdominal pain, change in bowel habits or melena Genitourinary: negative for dysuria or gross hematuria Musculoskeletal: negative for new gait disturbance or muscular weakness Integumentary: negative for new or persistent rashes Neurological: negative for TIA or stroke symptoms Psychiatric: negative for SI or delusions Allergic/Immunologic: negative for hives  Patient Care Team    Relationship Specialty Notifications Start End  Willow OraAndy, Camille L, MD PCP - General Family Medicine  11/27/18   Charna ElizabethMann, Jyothi, MD Consulting Physician Gastroenterology  11/27/18     Objective  Vitals: BP 118/76   Pulse 76   Temp 98.2 F (36.8 C) (Oral)   Resp 14   Ht 5\' 1"  (1.549 m)   Wt 132 lb (59.9 kg)   SpO2 96%   BMI 24.94 kg/m  General:  Well developed, well nourished, no acute distress  Psych:  Alert and oriented,normal mood and affect HEENT:  Normocephalic,  atraumatic, non-icteric sclera, PERRL, oropharynx is without mass or exudate, supple neck without adenopathy, mass or thyromegaly Cardiovascular:  RRR without gallop, rub or murmur, nondisplaced PMI Respiratory:  Good breath sounds bilaterally, CTAB with normal respiratory effort MSK: no deformities, contusions. Joints are without erythema or swelling, right proximal 5th metacarpal is ttp w/o swelling or palpable deformity. Nl grip and interosseous strength. Nl finger alignment.  Skin:  Warm, no rashes or suspicious lesions noted, scabs on knees still present. Healing abrasion/scar above upper lip present.  Neurologic:    Mental status is normal. Gross motor and sensory exams are normal. Normal gait   Commons side effects, risks, benefits, and alternatives for medications and treatment plan prescribed today were discussed, and the patient expressed understanding of the given instructions. Patient is instructed to call or message via MyChart if he/she  has any questions or concerns regarding our treatment plan. No barriers to understanding were identified. We discussed Red Flag symptoms and signs in detail. Patient expressed understanding regarding what to do in case of urgent or emergency type symptoms.   Medication list was reconciled, printed and provided to the patient in AVS. Patient instructions and summary information was reviewed with the patient as documented in the AVS. This note was prepared with assistance of Dragon voice recognition software. Occasional wrong-word or sound-a-like substitutions may have occurred due to the inherent limitations of voice recognition software

## 2018-11-28 ENCOUNTER — Encounter: Payer: Self-pay | Admitting: *Deleted

## 2019-01-01 ENCOUNTER — Other Ambulatory Visit: Payer: Self-pay

## 2019-01-01 ENCOUNTER — Ambulatory Visit (INDEPENDENT_AMBULATORY_CARE_PROVIDER_SITE_OTHER): Payer: Medicare Other | Admitting: Family Medicine

## 2019-01-01 ENCOUNTER — Encounter: Payer: Self-pay | Admitting: Family Medicine

## 2019-01-01 ENCOUNTER — Other Ambulatory Visit (HOSPITAL_COMMUNITY)
Admission: RE | Admit: 2019-01-01 | Discharge: 2019-01-01 | Disposition: A | Discharge status: Home / Self Care | Payer: Medicare Other | Source: Ambulatory Visit | Attending: Family Medicine | Admitting: Family Medicine

## 2019-01-01 VITALS — BP 92/66 | HR 70 | Temp 98.2°F | Resp 16 | Ht 61.0 in | Wt 131.4 lb

## 2019-01-01 DIAGNOSIS — Z1212 Encounter for screening for malignant neoplasm of rectum: Secondary | ICD-10-CM

## 2019-01-01 DIAGNOSIS — Z124 Encounter for screening for malignant neoplasm of cervix: Secondary | ICD-10-CM

## 2019-01-01 DIAGNOSIS — Z0000 Encounter for general adult medical examination without abnormal findings: Secondary | ICD-10-CM | POA: Diagnosis present

## 2019-01-01 DIAGNOSIS — Z1159 Encounter for screening for other viral diseases: Secondary | ICD-10-CM

## 2019-01-01 DIAGNOSIS — Z1211 Encounter for screening for malignant neoplasm of colon: Secondary | ICD-10-CM | POA: Insufficient documentation

## 2019-01-01 DIAGNOSIS — M810 Age-related osteoporosis without current pathological fracture: Secondary | ICD-10-CM

## 2019-01-01 NOTE — Patient Instructions (Addendum)
Please return in 12 months for your annual complete physical; please come fasting. Please schedule an AWV with Kim at any time.   Medicare recommends an Annual Wellness Visit for all patients. Please schedule this to be done with our Nurse Educator, Maudie Mercury. This is an informative "talk" visit; it's goals are to ensure that your health care needs are being met and to give you education regarding avoiding falls, ensuring you are not suffering from depression or problems with memory or thinking, and to educate you on Advance Care Planning. It helps me take good care of you!  If you have any questions or concerns, please don't hesitate to send me a message via MyChart or call the office at [phone]. Thank you for visiting with [place] today! It's our pleasure caring for you.  Try adding tylenol and tumeric for your knee pain. Keep walking!! Biotin mouth rinse can help dry mouth. Recommendations for women to keep healthy:   EXERCISE AND DIET: We recommended that you start or continue a regular exercise program for good health. Regular exercise means any activity that makes your heart beat faster and makes you sweat. We recommend exercising at least 30 minutes per day at least 3 days a week, preferably 4 or 5. We also recommend a diet low in fat and sugar. Inactivity, poor dietary choices and obesity can cause diabetes, heart attack, stroke, and kidney damage, among others.   ALCOHOL AND SMOKING: Women should limit their alcohol intake to no more than 7 drinks/beers/glasses of wine (combined, not each!) per week. Moderation of alcohol intake to this level decreases your risk of breast cancer and liver damage. And of course, no recreational drugs are part of a healthy lifestyle. And absolutely no smoking or even second hand smoke. Most people know smoking can cause heart and lung diseases, but did you know it also contributes to weakening of your bones? Aging of your skin? Yellowing of your teeth and  nails?  CALCIUM AND VITAMIN D: Adequate intake of calcium and Vitamin D are recommended. The recommendations for exact amounts of these supplements seem to change often, but generally speaking 600 mg of calcium (either carbonate or citrate) and 800 units of Vitamin D per day seems prudent. Certain women may benefit from higher intake of Vitamin D. If you are among these women, your doctor will have told you during your visit.   PAP SMEARS: Pap smears, to check for cervical cancer or precancers, have traditionally been done yearly, although recent scientific advances have shown that most women can have pap smears less often. However, every woman still should have a physical exam from her gynecologist every year. It will include a breast check, inspection of the vulva and vagina to check for abnormal growths or skin changes, a visual exam of the cervix, and then an exam to evaluate the size and shape of the uterus and ovaries. And after 66 years of age, a rectal exam is indicated to check for rectal cancers. We will also provide age appropriate advice regarding health maintenance, like when you should have certain vaccines, screening for sexually transmitted diseases, bone density testing, colonoscopy, mammograms, etc.   MAMMOGRAMS: All women over 56 years old should have a yearly mammogram. Many facilities now offer a "3D" mammogram, which may cost around $50 extra out of pocket. If possible, we recommend you accept the option to have the 3D mammogram performed. It both reduces the number of women who will be called back for extra views which  then turn out to be normal, and it is better than the routine mammogram at detecting truly abnormal areas.   COLONOSCOPY: Colonoscopy to screen for colon cancer is recommended for all women at age 50. We know, you hate the idea of the prep. We agree, BUT, having colon cancer and not knowing it is worse!! Colon cancer so often starts as a polyp that can be seen and  removed at colonscopy, which can quite literally save your life! And if your first colonoscopy is normal and you have no family history of colon cancer, most women don't have to have it again for 10 years. Once every ten years, you can do something that may end up saving your life, right? We will be happy to help you get it scheduled when you are ready. Be sure to check your insurance coverage so you understand how much it will cost. It may be covered as a preventative service at no cost, but you should check your particular policy.     

## 2019-01-01 NOTE — Progress Notes (Signed)
Subjective  Chief Complaint  Patient presents with  . Annual Exam    Fasting    HPI: Nicole Petty is a 68 y.o. female who presents to [facility] today for a Female Wellness Visit. She also has the concerns and/or needs as listed above in the chief complaint. These will be addressed in addition to the Health Maintenance Visit.   Wellness Visit: annual visit with health maintenance review and exam with Pap   Health maintenance: Continues healthy lifestyle.  Due for colorectal cancer screening.  Due for Pap smear.  Mammogram and bone density reviewed and up-to-date.  Immunizations updated as well. Chronic disease f/u and/or acute problem visit: (deemed necessary to be done in addition to the wellness visit):  Osteoporosis tolerating Boniva calcium and vitamin D.  Follow-up hand pain, completely resolved.  Reviewed normal x-ray.  Assessment  1. Annual physical exam   2. Osteoporosis without current pathological fracture, unspecified osteoporosis type   3. Cervical cancer screening   4. Screening for colorectal cancer   5. Need for hepatitis C screening test      Plan  Female Wellness Visit:  Age appropriate Health Maintenance and Prevention measures were discussed with patient. Included topics are cancer screening recommendations, ways to keep healthy (see AVS) including dietary and exercise recommendations, regular eye and dental care, use of seat belts, and avoidance of moderate alcohol use and tobacco use.  Cologuard ordered for colorectal cancer screening.  Options discussed.  Await Pap smear results.  If normal, no further testing needed.  BMI: discussed patient's BMI and encouraged positive lifestyle modifications to help get to or maintain a target BMI.  HM needs and immunizations were addressed and ordered. See below for orders. See HM and immunization section for updates.  Routine labs and screening tests ordered including cmp, cbc and  lipids where appropriate.  Discussed recommendations regarding Vit D and calcium supplementation (see AVS)  Chronic disease management visit and/or acute problem visit:  Osteoporosis: Continue Boniva. Follow up: Return in about 1 year (around 01/01/2020) for complete physical, AWV at patient's convenience.  Orders Placed This Encounter  Procedures  . CBC with Differential/Platelet  . Comprehensive metabolic panel  . Lipid panel  . TSH  . VITAMIN D 25 Hydroxy (Vit-D Deficiency, Fractures)  . Cologuard  . Hepatitis C antibody   No orders of the defined types were placed in this encounter.     Lifestyle: Body mass index is 24.83 kg/m. Wt Readings from Last 3 Encounters:  01/01/19 131 lb 6.4 oz (59.6 kg)  11/27/18 132 lb (59.9 kg)  11/25/16 130 lb (59 kg)    Patient Active Problem List   Diagnosis Date Noted  . Osteoarthritis of both hands 11/27/2018    1st mcps   . Osteoporosis 09/29/2016    Dexa 07/2018 [facility], pt reports improvement on Boniva since 2017, had been on fosamax in the distant past.  Osteopenia at multiple sites (improved on boniva).. Follow up in 2 years. R femur neck T-Score: -2.10 L femur T-Score: -1.90 R Total femur T-Score: -1.60 L Total femur T-Score: -1.90 AP Total Spine T-Score: -2.40    Health Maintenance  Topic Date Due  . Hepatitis C Screening  Dec 21, 1950  . Fecal DNA (Cologuard)  11/06/2002  . MAMMOGRAM  09/11/2019  . PNA vac Low Risk Adult (2 of 2 - PPSV23) 11/29/2019  . DEXA SCAN  09/10/2020  . TETANUS/TDAP  05/28/2028  . INFLUENZA VACCINE  Completed  Immunization History  Administered Date(s) Administered  . Influenza-Unspecified 11/06/2018  . Pneumococcal Conjugate-13 11/28/2018  . Tdap 08/29/2007  . Zoster 04/08/2013  . Zoster Recombinat (Shingrix) 03/06/2018, 06/18/2018   We updated and reviewed the patient's past history in detail and it is documented below. Allergies: Patient has No Known Allergies. Past Medical  History Patient  has a past medical history of Osteoarthritis, Osteopenia, Vitamin D deficiency, and Xerosis cutis. Past Surgical History Patient  has no past surgical history on file. Family History: Patient family history includes Arthritis in her mother; Hearing loss in her father and mother; Hypertension in her father and mother; Osteoarthritis in an other family member. Social History:  Patient  reports that she has never smoked. She has never used smokeless tobacco. She reports previous alcohol use. She reports that she does not use drugs.  Review of Systems: Constitutional: negative for fever or malaise Ophthalmic: negative for photophobia, double vision or loss of vision Cardiovascular: negative for chest pain, dyspnea on exertion, or new LE swelling Respiratory: negative for SOB or persistent cough Gastrointestinal: negative for abdominal pain, change in bowel habits or melena Genitourinary: negative for dysuria or gross hematuria, no abnormal uterine bleeding or disharge Musculoskeletal: negative for new gait disturbance or muscular weakness Integumentary: negative for new or persistent rashes, no breast lumps Neurological: negative for TIA or stroke symptoms Psychiatric: negative for SI or delusions Allergic/Immunologic: negative for hives  Patient Care Team    Relationship Specialty Notifications Start End  Willow Ora, MD PCP - General Family Medicine  11/27/18   Charna Elizabeth, MD Consulting Physician Gastroenterology  11/27/18   Janet Berlin, MD Consulting Physician Ophthalmology  11/27/18   Juluis Pitch, DDS  Dentistry  11/27/18     Objective  Vitals: BP 92/66   Pulse 70   Temp 98.2 F (36.8 C) (Oral)   Resp 16   Ht 5\' 1"  (1.549 m)   Wt 131 lb 6.4 oz (59.6 kg)   SpO2 95%   BMI 24.83 kg/m  General:  Well developed, well nourished, no acute distress  Psych:  Alert and orientedx3,normal mood and affect HEENT:  Normocephalic, atraumatic, non-icteric sclera,  PERRL, oropharynx is clear without mass or exudate, supple neck without adenopathy, mass or thyromegaly Cardiovascular:  Normal S1, S2, RRR without gallop, rub or murmur, nondisplaced PMI Respiratory:  Good breath sounds bilaterally, CTAB with normal respiratory effort Gastrointestinal: normal bowel sounds, soft, non-tender, no noted masses. No HSM MSK: no deformities, contusions. Joints are without erythema or swelling. Spine and CVA region are nontender Skin:  Warm, no rashes or suspicious lesions noted, several hypopigmented areas on face, no flaking Neurologic:    Mental status is normal. CN 2-11 are normal. Gross motor and sensory exams are normal. Normal gait. No tremor Breast Exam: No mass, skin retraction or nipple discharge is appreciated in either breast. No axillary adenopathy. Fibrocystic changes are not noted Pelvic Exam: Normal external genitalia, no vulvar or vaginal lesions present.  Atrophic vaginitis, clear cervix w/o CMT. Bimanual exam reveals a nontender fundus w/o masses, nl size. No adnexal masses present. No inguinal adenopathy. A PAP smear was performed.    Commons side effects, risks, benefits, and alternatives for medications and treatment plan prescribed today were discussed, and the patient expressed understanding of the given instructions. Patient is instructed to call or message via MyChart if he/she has any questions or concerns regarding our treatment plan. No barriers to understanding were identified. We discussed Red Flag symptoms and signs  in detail. Patient expressed understanding regarding what to do in case of urgent or emergency type symptoms.   Medication list was reconciled, printed and provided to the patient in AVS. Patient instructions and summary information was reviewed with the patient as documented in the AVS. This note was prepared with assistance of Dragon voice recognition software. Occasional wrong-word or sound-a-like substitutions may have occurred  due to the inherent limitations of voice recognition software

## 2019-01-02 LAB — CBC WITH DIFFERENTIAL/PLATELET
Absolute Monocytes: 229 cells/uL (ref 200–950)
BASOS ABS: 9 {cells}/uL (ref 0–200)
Basophils Relative: 0.3 %
Eosinophils Absolute: 78 cells/uL (ref 15–500)
Eosinophils Relative: 2.7 %
HCT: 39.3 % (ref 35.0–45.0)
Hemoglobin: 13.3 g/dL (ref 11.7–15.5)
Lymphs Abs: 766 cells/uL — ABNORMAL LOW (ref 850–3900)
MCH: 31 pg (ref 27.0–33.0)
MCHC: 33.8 g/dL (ref 32.0–36.0)
MCV: 91.6 fL (ref 80.0–100.0)
MPV: 9.8 fL (ref 7.5–12.5)
Monocytes Relative: 7.9 %
NEUTROS PCT: 62.7 %
Neutro Abs: 1818 cells/uL (ref 1500–7800)
Platelets: 296 10*3/uL (ref 140–400)
RBC: 4.29 10*6/uL (ref 3.80–5.10)
RDW: 12.6 % (ref 11.0–15.0)
Total Lymphocyte: 26.4 %
WBC: 2.9 10*3/uL — ABNORMAL LOW (ref 3.8–10.8)

## 2019-01-02 LAB — COMPREHENSIVE METABOLIC PANEL
AG Ratio: 1.6 (calc) (ref 1.0–2.5)
ALT: 34 U/L — AB (ref 6–29)
AST: 30 U/L (ref 10–35)
Albumin: 4.1 g/dL (ref 3.6–5.1)
Alkaline phosphatase (APISO): 57 U/L (ref 37–153)
BUN: 17 mg/dL (ref 7–25)
CO2: 25 mmol/L (ref 20–32)
Calcium: 9.1 mg/dL (ref 8.6–10.4)
Chloride: 105 mmol/L (ref 98–110)
Creat: 0.73 mg/dL (ref 0.50–0.99)
Globulin: 2.5 g/dL (calc) (ref 1.9–3.7)
Glucose, Bld: 88 mg/dL (ref 65–99)
POTASSIUM: 3.9 mmol/L (ref 3.5–5.3)
Sodium: 141 mmol/L (ref 135–146)
Total Bilirubin: 0.6 mg/dL (ref 0.2–1.2)
Total Protein: 6.6 g/dL (ref 6.1–8.1)

## 2019-01-02 LAB — LIPID PANEL
Cholesterol: 210 mg/dL — ABNORMAL HIGH (ref <200)
HDL: 81 mg/dL (ref >=50)
LDL Cholesterol (Calc): 116 mg/dL (calc) — ABNORMAL HIGH
Non-HDL Cholesterol (Calc): 129 mg/dL (calc) (ref <130)
Total CHOL/HDL Ratio: 2.6 (calc) (ref <5.0)
Triglycerides: 52 mg/dL (ref <150)

## 2019-01-02 LAB — VITAMIN D 25 HYDROXY (VIT D DEFICIENCY, FRACTURES): Vit D, 25-Hydroxy: 39 ng/mL (ref 30–100)

## 2019-01-02 LAB — TSH: TSH: 1.19 mIU/L (ref 0.40–4.50)

## 2019-01-02 LAB — HEPATITIS C ANTIBODY
Hepatitis C Ab: NONREACTIVE
SIGNAL TO CUT-OFF: 0.01 (ref <1.00)

## 2019-01-03 LAB — CYTOLOGY - PAP: Diagnosis: NEGATIVE

## 2019-01-04 ENCOUNTER — Encounter: Payer: Self-pay | Admitting: Family Medicine

## 2019-01-09 ENCOUNTER — Other Ambulatory Visit: Payer: Self-pay | Admitting: *Deleted

## 2019-01-09 DIAGNOSIS — Z1211 Encounter for screening for malignant neoplasm of colon: Secondary | ICD-10-CM

## 2019-01-09 DIAGNOSIS — Z1212 Encounter for screening for malignant neoplasm of rectum: Principal | ICD-10-CM

## 2019-04-09 ENCOUNTER — Telehealth: Payer: Self-pay | Admitting: Physical Therapy

## 2019-04-09 NOTE — Telephone Encounter (Signed)
Copied from [place] [id number]. Topic: General - Other >> Apr 09, 2019  1:22 PM Keene Breath wrote: Reason for CRM: Patient called to request that Dr. Tamela Oddi nurse call her regarding her continuing to see Dr. Jonni Sanger at [facility].  Patient would like to get refills on medications and speak with the nurse about a few concerns.  Please call patient back at [phone]

## 2019-04-10 NOTE — Telephone Encounter (Signed)
Spoke with pt, she was needing a refill on Boniva, she states that she contacted her pharmacy and had a 3 month supply. She will call back when a new RX is needed.

## 2019-07-19 ENCOUNTER — Telehealth: Payer: Self-pay | Admitting: Family Medicine

## 2019-07-19 NOTE — Telephone Encounter (Signed)
I called the patient to schedule AWV with Loma Sousa, but there was no answer. VDM (Dee-Dee)

## 2019-07-23 ENCOUNTER — Encounter: Payer: Self-pay | Admitting: Family Medicine

## 2019-07-23 ENCOUNTER — Ambulatory Visit (INDEPENDENT_AMBULATORY_CARE_PROVIDER_SITE_OTHER): Payer: Medicare Other | Admitting: Family Medicine

## 2019-07-23 ENCOUNTER — Other Ambulatory Visit: Payer: Self-pay

## 2019-07-23 VITALS — BP 108/70 | HR 66 | Temp 97.6°F | Resp 14 | Ht 61.0 in | Wt 128.0 lb

## 2019-07-23 DIAGNOSIS — Z1212 Encounter for screening for malignant neoplasm of rectum: Secondary | ICD-10-CM | POA: Diagnosis not present

## 2019-07-23 DIAGNOSIS — M810 Age-related osteoporosis without current pathological fracture: Secondary | ICD-10-CM | POA: Diagnosis not present

## 2019-07-23 DIAGNOSIS — Z1211 Encounter for screening for malignant neoplasm of colon: Secondary | ICD-10-CM

## 2019-07-23 DIAGNOSIS — Z8781 Personal history of (healed) traumatic fracture: Secondary | ICD-10-CM

## 2019-07-23 DIAGNOSIS — Z23 Encounter for immunization: Secondary | ICD-10-CM | POA: Diagnosis not present

## 2019-07-23 MED ORDER — IBANDRONATE SODIUM 150 MG PO TABS: ORAL_TABLET | ORAL | 3 refills | Status: DC

## 2019-07-23 NOTE — Patient Instructions (Addendum)
Please return in March 2021 for your annual complete physical; please come fasting.  I've refilled generic boniva. IF [facility] can't get it, then we will switch to fosamax.  We will try again to get the cologuard kit sent to you for your colon cancer screening test. Please call me if you have anymore problems or don't receive the kit.  Your next mammogram is due in November.   Today you were given your flu vaccination.    If you have any questions or concerns, please don't hesitate to send me a message via MyChart or call the office at [phone]. Thank you for visiting with [place] today! It's our pleasure caring for you.

## 2019-07-23 NOTE — Progress Notes (Signed)
Subjective  CC:  Chief Complaint  Patient presents with  . Osteoporosis    Needs refill on Boniva    HPI: Nicole Petty is a 68 y.o. female who presents to the office today to address the problems listed above in the chief complaint.  Osteoporosis: ran out of boniva, had to change to fosamax for last 3 months. Was in [place] helping ailing mom. Now due refills. dexa is up to date and improving on boniva. No AEs.  Patellar fracture in July; now healed. Fell on concrete: fragility fracture due to osteoporosis. Update PL  crc screen: never got cologuard kit; ordered back in march.    Assessment  1. Age-related osteoporosis without current pathological fracture   2. Screening for colorectal cancer   3. History of patellar fracture      Plan   osteoporosis:  boniva for two more years, then drug holiday. Educated. Continue weight bearing exercise, calcium and vitamin D.  If pharmacy unable to get Boniva, will change to Fosamax.  Patient stands and agrees with care plan  Fragility fracture: Now healed.  Fall prevention again discussed  Colorectal cancer screening: contacted cologuard rep: they will send out kit. Pt agrees.   mammo due in November.  Flu shot today.   Follow up: march for cpe, 2021  Visit date not found  No orders of the defined types were placed in this encounter.  Meds ordered this encounter  Medications  . ibandronate (BONIVA) 150 MG tablet    Sig: TAKE 1 TABLET BY MOUTH  EVERY MONTH    Dispense:  3 tablet    Refill:  3      I reviewed the patients updated PMH, FH, and SocHx.    Patient Active Problem List   Diagnosis Date Noted  . Osteoarthritis of both hands 11/27/2018  . Osteoporosis 09/29/2016   Current Meds  Medication Sig  . b complex vitamins capsule Take by mouth.  . Boswellia-Glucosamine-Vit D (GLUCOSAMINE COMPLEX PO) Take 2 tablets by mouth daily.   Marland Kitchen CALCIUM PO Take 1 tablet by mouth daily.  . Calcium-Vitamin D 500-125 MG-UNIT TABS  Take by mouth.  . Cholecalciferol (VITAMIN D PO) Take 1 tablet by mouth daily.   . cycloSPORINE (RESTASIS) 0.05 % ophthalmic emulsion Place 1 drop into both eyes 2 (two) times daily.   Marland Kitchen glucosamine-chondroitin 500-400 MG tablet Take by mouth.  . ibandronate (BONIVA) 150 MG tablet TAKE 1 TABLET BY MOUTH  EVERY MONTH  . Omega-3 Fatty Acids (FISH OIL PO) Take 1 tablet by mouth daily.   . [DISCONTINUED] ibandronate (BONIVA) 150 MG tablet TAKE 1 TABLET BY MOUTH  EVERY MONTH    Allergies: Patient has No Known Allergies. Family History: Patient family history includes Arthritis in her mother; Hearing loss in her father and mother; Hypertension in her father and mother; Osteoarthritis in an other family member. Social History:  Patient  reports that she has never smoked. She has never used smokeless tobacco. She reports previous alcohol use. She reports that she does not use drugs.  Review of Systems: Constitutional: Negative for fever malaise or anorexia Cardiovascular: negative for chest pain Respiratory: negative for SOB or persistent cough Gastrointestinal: negative for abdominal pain  Objective  Vitals: BP 108/70   Pulse 66   Temp 97.6 F (36.4 C) (Tympanic)   Resp 14   Ht '5\' 1"'$  (1.549 m)   Wt 128 lb (58.1 kg)   SpO2 96%   BMI 24.19 kg/m  General:  no acute distress , A&Ox3 HEENT: PEERL, conjunctiva normal, Oropharynx moist,neck is supple Cardiovascular:  RRR without murmur or gallop.  Respiratory:  Good breath sounds bilaterally, CTAB with normal respiratory effort Skin:  Warm, no rashes     Commons side effects, risks, benefits, and alternatives for medications and treatment plan prescribed today were discussed, and the patient expressed understanding of the given instructions. Patient is instructed to call or message via MyChart if he/she has any questions or concerns regarding our treatment plan. No barriers to understanding were identified. We discussed Red Flag symptoms  and signs in detail. Patient expressed understanding regarding what to do in case of urgent or emergency type symptoms.   Medication list was reconciled, printed and provided to the patient in AVS. Patient instructions and summary information was reviewed with the patient as documented in the AVS. This note was prepared with assistance of Dragon voice recognition software. Occasional wrong-word or sound-a-like substitutions may have occurred due to the inherent limitations of voice recognition software

## 2019-08-05 LAB — COLOGUARD

## 2019-08-28 ENCOUNTER — Encounter: Payer: Self-pay | Admitting: *Deleted

## 2019-08-28 ENCOUNTER — Telehealth: Payer: Self-pay

## 2019-08-28 LAB — COLOGUARD: Cologuard: NEGATIVE

## 2019-08-28 NOTE — Telephone Encounter (Signed)
Responded to pt via MyChart

## 2019-08-28 NOTE — Telephone Encounter (Signed)
Copied from [place] [id number]. Topic: General - Call Back - No Documentation >> Aug 28, 2019  3:43 PM Erick Blinks wrote: Reason for CRM: Requesting call back to discuss cologuard lab results, requesting correspondence via [facility] contact: [phone]

## 2019-09-06 ENCOUNTER — Other Ambulatory Visit: Payer: Self-pay | Admitting: *Deleted

## 2019-09-06 DIAGNOSIS — Z20822 Contact with and (suspected) exposure to covid-19: Secondary | ICD-10-CM

## 2019-09-08 LAB — NOVEL CORONAVIRUS, NAA: SARS-CoV-2, NAA: NOT DETECTED

## 2019-09-13 LAB — HM MAMMOGRAPHY

## 2019-10-10 ENCOUNTER — Encounter: Payer: Self-pay | Admitting: Family Medicine

## 2019-11-07 ENCOUNTER — Other Ambulatory Visit: Payer: Self-pay

## 2019-11-07 ENCOUNTER — Ambulatory Visit (INDEPENDENT_AMBULATORY_CARE_PROVIDER_SITE_OTHER): Payer: Medicare Other | Admitting: Podiatry

## 2019-11-07 ENCOUNTER — Encounter: Payer: Self-pay | Admitting: Podiatry

## 2019-11-07 DIAGNOSIS — L03032 Cellulitis of left toe: Secondary | ICD-10-CM | POA: Diagnosis not present

## 2019-11-07 MED ORDER — DOXYCYCLINE HYCLATE 100 MG PO TABS: 100.0000 mg | ORAL_TABLET | Freq: Two times a day (BID) | ORAL | 0 refills | Status: DC

## 2019-11-07 NOTE — Patient Instructions (Signed)

## 2019-11-07 NOTE — Progress Notes (Signed)
Subjective:   Patient ID: Nicole Petty, female   DOB: 69 y.o.   MRN: [id number]   HPI Patient presents stating that she has had a painful area on her left big toe that has been going on for several months and she does not remember injury.  Her nail itself looks like it may been injured in the past and there is some redness proximal portion of the nailbed medial side.  Patient does not smoke likes to be active   ROS      Objective:  Physical Exam  Neurovascular status intact with patient found to have inflammation redness around the medial side of the left hallux with localized irritation of the surface.  There is no active drainage but I am concerned about the appearance     Assessment:  Paronychia of the left hallux medial side localized     Plan:  H&P reviewed condition and I went ahead today I infiltrated the left hallux 60 mg like Marcaine mixture sterile prep done and then I carefully excised the medial side of the nail cleaned out the bed flush the surface and removed any necrotic tissue.  Instructed on soaks and bandage usage and reappoint in the next several weeks or earlier if any issues were to occur

## 2019-12-09 ENCOUNTER — Ambulatory Visit: Payer: Medicare Other

## 2019-12-27 ENCOUNTER — Ambulatory Visit: Payer: Medicare Other

## 2020-01-28 ENCOUNTER — Telehealth: Payer: Self-pay | Admitting: Family Medicine

## 2020-01-28 NOTE — Telephone Encounter (Signed)
I called the patient to schedule AWV-I w/ Toni Amend, but she declined.  She doesn't feel like she needs it.

## 2020-04-27 ENCOUNTER — Other Ambulatory Visit: Payer: Self-pay

## 2020-04-27 ENCOUNTER — Encounter: Payer: Self-pay | Admitting: Family Medicine

## 2020-04-27 ENCOUNTER — Ambulatory Visit (INDEPENDENT_AMBULATORY_CARE_PROVIDER_SITE_OTHER): Payer: Medicare Other | Admitting: Family Medicine

## 2020-04-27 VITALS — BP 110/62 | HR 72 | Temp 97.4°F | Ht 61.0 in | Wt 131.2 lb

## 2020-04-27 DIAGNOSIS — Z23 Encounter for immunization: Secondary | ICD-10-CM | POA: Diagnosis not present

## 2020-04-27 DIAGNOSIS — Z0000 Encounter for general adult medical examination without abnormal findings: Secondary | ICD-10-CM | POA: Diagnosis not present

## 2020-04-27 DIAGNOSIS — M19041 Primary osteoarthritis, right hand: Secondary | ICD-10-CM

## 2020-04-27 DIAGNOSIS — M810 Age-related osteoporosis without current pathological fracture: Secondary | ICD-10-CM

## 2020-04-27 DIAGNOSIS — M19042 Primary osteoarthritis, left hand: Secondary | ICD-10-CM

## 2020-04-27 DIAGNOSIS — Z1231 Encounter for screening mammogram for malignant neoplasm of breast: Secondary | ICD-10-CM

## 2020-04-27 LAB — TSH: TSH: 1.28 u[IU]/mL (ref 0.35–4.50)

## 2020-04-27 LAB — CBC WITH DIFFERENTIAL/PLATELET
Basophils Absolute: 0 10*3/uL (ref 0.0–0.1)
Basophils Relative: 0.4 % (ref 0.0–3.0)
Eosinophils Absolute: 0.1 10*3/uL (ref 0.0–0.7)
Eosinophils Relative: 2.9 % (ref 0.0–5.0)
HCT: 39.3 % (ref 36.0–46.0)
Hemoglobin: 13.1 g/dL (ref 12.0–15.0)
Lymphocytes Relative: 24.4 % (ref 12.0–46.0)
Lymphs Abs: 1 10*3/uL (ref 0.7–4.0)
MCHC: 33.3 g/dL (ref 30.0–36.0)
MCV: 94.3 fl (ref 78.0–100.0)
Monocytes Absolute: 0.3 10*3/uL (ref 0.1–1.0)
Monocytes Relative: 7.8 % (ref 3.0–12.0)
Neutro Abs: 2.6 10*3/uL (ref 1.4–7.7)
Neutrophils Relative %: 64.5 % (ref 43.0–77.0)
Platelets: 251 10*3/uL (ref 150.0–400.0)
RBC: 4.17 Mil/uL (ref 3.87–5.11)
RDW: 14 % (ref 11.5–15.5)
WBC: 4 10*3/uL (ref 4.0–10.5)

## 2020-04-27 LAB — COMPREHENSIVE METABOLIC PANEL
ALT: 16 U/L (ref 0–35)
AST: 21 U/L (ref 0–37)
Albumin: 4.4 g/dL (ref 3.5–5.2)
Alkaline Phosphatase: 62 U/L (ref 39–117)
BUN: 18 mg/dL (ref 6–23)
CO2: 26 mEq/L (ref 19–32)
Calcium: 9.5 mg/dL (ref 8.4–10.5)
Chloride: 107 mEq/L (ref 96–112)
Creatinine, Ser: 0.78 mg/dL (ref 0.40–1.20)
GFR: 73.56 mL/min (ref >60.00)
Glucose, Bld: 89 mg/dL (ref 70–99)
Potassium: 4 mEq/L (ref 3.5–5.1)
Sodium: 141 mEq/L (ref 135–145)
Total Bilirubin: 0.6 mg/dL (ref 0.2–1.2)
Total Protein: 6.8 g/dL (ref 6.0–8.3)

## 2020-04-27 LAB — VITAMIN D 25 HYDROXY (VIT D DEFICIENCY, FRACTURES): VITD: 48.66 ng/mL (ref 30.00–100.00)

## 2020-04-27 LAB — LIPID PANEL
Cholesterol: 222 mg/dL — ABNORMAL HIGH (ref 0–200)
HDL: 81.3 mg/dL (ref >39.00)
LDL Cholesterol: 124 mg/dL — ABNORMAL HIGH (ref 0–99)
NonHDL: 141.03
Total CHOL/HDL Ratio: 3
Triglycerides: 83 mg/dL (ref 0.0–149.0)
VLDL: 16.6 mg/dL (ref 0.0–40.0)

## 2020-04-27 NOTE — Patient Instructions (Addendum)
Please return in 12 months for your annual complete physical; please come fasting.  I will release your lab results to you on your MyChart account with further instructions. Please reply with any questions. Your cholesterol levels are below and they looked good last year.   Today you were given your Pneumovax (pneumococcal 23) vaccination.  You may try voltaren gel for your knee and hand pain. It is OTC.  If you have any questions or concerns, please don't hesitate to send me a message via MyChart or call the office at [phone]. Thank you for visiting with [place] today! It's our pleasure caring for you.  I have ordered a mammogram and/or bone density for you as we discussed today: [x]   Mammogram  [x]   Bone Density  Please call the office checked below to schedule your appointment for November 2021: Your appointment will at the following location  []   [facility]      [address] [place],        [address] Wing         [x]   [facility]  [address] [place],  [facility]  Make sure to wear two peace clothing  No lotions powders or deodorants the day of the appointment Make sure to bring picture ID and insurance card.  Bring list of medications you are currently taking including any supplements.   Lab Results  Component Value Date   CHOL 210 (H) 01/01/2019   HDL 81 01/01/2019   LDLCALC 116 (H) 01/01/2019   TRIG 52 01/01/2019   CHOLHDL 2.6 01/01/2019

## 2020-04-27 NOTE — Progress Notes (Signed)
Subjective  Chief Complaint  Patient presents with   Annual Exam    not fasting     HPI: Nicole Petty is a 69 y.o. female who presents to [facility] [place] today for a Female Wellness Visit. She also has the concerns and/or needs as listed above in the chief complaint. These will be addressed in addition to the Health Maintenance Visit.   Wellness Visit: annual visit with health maintenance review and exam without Pap   HM: due mammo and dexa in November. Healthy lifestyle. Walks 5x/week. Has gained a few pounds.   Chronic disease f/u and/or acute problem visit: (deemed necessary to be done in addition to the wellness visit):  OA knees and hands.   Osteoporosis on boniva; due for repeat dexa in November. To complete 5 years of treatment in 2022.   Assessment  1. Annual physical exam   2. Need for pneumococcal vaccination   3. Age-related osteoporosis without current pathological fracture   4. Primary osteoarthritis of both hands   5. Encounter for screening mammogram for breast cancer      Plan  Female Wellness Visit:  Age appropriate Health Maintenance and Prevention measures were discussed with patient. Included topics are cancer screening recommendations, ways to keep healthy (see AVS) including dietary and exercise recommendations, regular eye and dental care, use of seat belts, and avoidance of moderate alcohol use and tobacco use.   BMI: discussed patient's BMI and encouraged positive lifestyle modifications to help get to or maintain a target BMI.  HM needs and immunizations were addressed and ordered. See below for orders. See HM and immunization section for updates. Pneumovax updated today  Routine labs and screening tests ordered including cmp, cbc and lipids where appropriate.  Discussed recommendations regarding Vit D and calcium supplementation (see AVS)  Chronic disease management visit and/or acute problem visit:  OA: trial of  voltaren gel.  Follow up: Return in about 1 year (around 04/27/2021) for complete physical.  Orders Placed This Encounter  Procedures   DG Bone Density   MM DIGITAL SCREENING BILATERAL   Pneumococcal polysaccharide vaccine 23-valent greater than or equal to 2yo subcutaneous/IM   CBC with Differential/Platelet   Comprehensive metabolic panel   Lipid panel   TSH   VITAMIN D 25 Hydroxy (Vit-D Deficiency, Fractures)   No orders of the defined types were placed in this encounter.     Lifestyle: Body mass index is 24.79 kg/m. 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  Osteopenia at multiple sites (improved on boniva).. Follow up in 2 years. R femur neck T-Score: -2.10 L femur T-Score: -1.90 R Total femur T-Score: -1.60 L Total femur T-Score: -1.90 AP Total Spine T-Score: -2.40    Health Maintenance  Topic Date Due   INFLUENZA VACCINE  05/31/2020   DEXA SCAN  09/10/2020   MAMMOGRAM  09/12/2020   Fecal DNA (Cologuard)  08/04/2022   TETANUS/TDAP  05/28/2028   COVID-19 Vaccine  Completed   Hepatitis C Screening  Completed  PNA vac Low Risk Adult  Completed   Immunization History  Administered Date(s) Administered   Fluad Quad(high Dose 65+) 07/23/2019   Influenza-Unspecified 11/06/2018   PFIZER SARS-COV-2 Vaccination 11/28/2019, 12/20/2019   Pneumococcal Conjugate-13 11/28/2018   Pneumococcal Polysaccharide-23 04/27/2020   Tdap 08/29/2007   Zoster 04/08/2013   Zoster Recombinat (Shingrix) 03/06/2018, 06/18/2018   We updated and  reviewed the patient's past history in detail and it is documented below. Allergies: Patient has No Known Allergies. Past Medical History Patient  has a past medical history of Osteoarthritis, Osteopenia, Vitamin D deficiency, and Xerosis cutis. Past Surgical History Patient  has no past surgical history on file. Family History: Patient family history includes Arthritis in her mother; Hearing loss in her father and mother; Hypertension in her father and mother; Osteoarthritis in an other family member. Social History:  Patient  reports that she has never smoked. She has never used smokeless tobacco. She reports previous alcohol use. She reports that she does not use drugs.  Review of Systems: Constitutional: negative for fever or malaise Ophthalmic: negative for photophobia, double vision or loss of vision Cardiovascular: negative for chest pain, dyspnea on exertion, or new LE swelling Respiratory: negative for SOB or persistent cough Gastrointestinal: negative for abdominal pain, change in bowel habits or melena Genitourinary: negative for dysuria or gross hematuria, no abnormal uterine bleeding or disharge Musculoskeletal: negative for new gait disturbance or muscular weakness Integumentary: negative for new or persistent rashes, no breast lumps Neurological: negative for TIA or stroke symptoms Psychiatric: negative for SI or delusions Allergic/Immunologic: negative for hives  Patient Care Team    Relationship Specialty Notifications Start End  Leamon Arnt, MD PCP - General Family Medicine  11/27/18   Juanita Craver, MD Consulting Physician Gastroenterology  11/27/18   Marygrace Drought, MD Consulting Physician Ophthalmology  11/27/18   Royston Cowper, [place]  Dentistry  11/27/18   Rod Can, MD Consulting Physician Orthopedic Surgery  07/23/19     Objective  Vitals: BP 110/62    Pulse 72    Temp (!) 97.4 F (36.3 C) (Temporal)    Ht 5\' 1"  (1.549 m)    Wt 131 lb 3.2 oz (59.5 kg)     SpO2 96%    BMI 24.79 kg/m  General:  Well developed, well nourished, no acute distress  Psych:  Alert and orientedx3,normal mood and affect HEENT:  Normocephalic, atraumatic, non-icteric sclera,  supple neck without adenopathy, mass or thyromegaly Cardiovascular:  Normal S1, S2, RRR without gallop, rub or murmur Respiratory:  Good breath sounds bilaterally, CTAB with normal respiratory effort Gastrointestinal: normal bowel sounds, soft, non-tender, no noted masses. No HSM MSK: no deformities, contusions. Joints are without erythema or swelling. Knee exam is normal bilaterally Skin:  Warm, no rashes or suspicious lesions noted Neurologic:    Mental status is normal. CN 2-11 are normal. Gross motor and sensory exams are normal. Normal gait. No tremor Breast Exam: No mass, skin retraction or nipple discharge is appreciated in either breast. No axillary adenopathy. Fibrocystic changes are not noted    Commons side effects, risks, benefits, and alternatives for medications and treatment plan prescribed today were discussed, and the patient expressed understanding of the given instructions. Patient is instructed to call or message via MyChart if he/she has any questions or concerns regarding our treatment plan. No barriers to understanding were identified. We discussed Red Flag symptoms and signs in detail. Patient expressed understanding regarding what to do in case of urgent or emergency  type symptoms.   Medication list was reconciled, printed and provided to the patient in AVS. Patient instructions and summary information was reviewed with the patient as documented in the AVS. This note was prepared with assistance of Dragon voice recognition software. Occasional wrong-word or sound-a-like substitutions may have occurred due to the inherent limitations of voice recognition software  This visit occurred during the SARS-CoV-2 public health emergency.  Safety protocols were in place, including  screening questions prior to the visit, additional usage of staff PPE, and extensive cleaning of exam room while observing appropriate contact time as indicated for disinfecting solutions.

## 2020-07-03 ENCOUNTER — Other Ambulatory Visit: Payer: Self-pay | Admitting: Family Medicine

## 2020-09-14 LAB — HM DEXA SCAN

## 2020-09-15 ENCOUNTER — Encounter: Payer: Self-pay | Admitting: Family Medicine

## 2020-10-16 ENCOUNTER — Other Ambulatory Visit: Payer: Self-pay

## 2020-10-16 ENCOUNTER — Encounter: Payer: Self-pay | Admitting: Family Medicine

## 2020-10-16 ENCOUNTER — Ambulatory Visit (INDEPENDENT_AMBULATORY_CARE_PROVIDER_SITE_OTHER): Payer: Medicare Other | Admitting: Family Medicine

## 2020-10-16 VITALS — BP 110/68 | HR 72 | Temp 98.2°F | Ht 61.0 in | Wt 129.0 lb

## 2020-10-16 DIAGNOSIS — M810 Age-related osteoporosis without current pathological fracture: Secondary | ICD-10-CM | POA: Diagnosis not present

## 2020-10-16 DIAGNOSIS — M65331 Trigger finger, right middle finger: Secondary | ICD-10-CM | POA: Diagnosis not present

## 2020-10-16 DIAGNOSIS — L811 Chloasma: Secondary | ICD-10-CM

## 2020-10-16 MED ORDER — CETAPHIL MOISTURIZING EX CREA
TOPICAL_CREAM | Freq: Every evening | CUTANEOUS | 2 refills | Status: DC | PRN
Start: 2020-10-16 — End: 2022-01-30

## 2020-10-16 MED ORDER — TRIAMCINOLONE ACETONIDE 40 MG/ML IJ SUSP
25.0000 mg | Freq: Once | INTRAMUSCULAR | Status: AC
  Administered 2020-10-16: 25 mg via INTRAMUSCULAR

## 2020-10-16 MED ORDER — LIDOCAINE HCL (PF) 1 % IJ SOLN
0.3000 mL | Freq: Once | INTRAMUSCULAR | Status: AC
  Administered 2020-10-16: 0.3 mL

## 2020-10-16 NOTE — Patient Instructions (Addendum)
Please return in 6 months for your annual complete physical; please come fasting.  I have sent in a request for your facial cream. Show them the prescription if needed as this is a special formulation.   Ice and rest your finger with splint x 1 week after injection as tolerated.   Please continue taking boniva monthly through 2022. We will stop Oct 31 2021.   If you have any questions or concerns, please don't hesitate to send me a message via MyChart or call the office at [phone]. Thank you for visiting with [place] today! It's our pleasure caring for you.   Trigger Finger  Trigger finger, also called stenosing tenosynovitis,  is a condition that causes a finger to get stuck in a bent position. Each finger has a tendon, which is a tough, cord-like tissue that connects muscle to bone, and each tendon passes through a tunnel of tissue called a tendon sheath. To move your finger, your tendon needs to glide freely through the sheath. Trigger finger happens when the tendon or the sheath thickens, making it difficult to move your finger. Trigger finger can affect any finger or a thumb. It may affect more than one finger. Mild cases may clear up with rest and medicine. Severe cases require more treatment. What are the causes? Trigger finger is caused by a thickened finger tendon or tendon sheath. The cause of this thickening is not known. What increases the risk? The following factors may make you more likely to develop this condition:  Doing activities that require a strong grip.  Having rheumatoid arthritis, gout, or diabetes.  Being 72-1 years old.  Being female. What are the signs or symptoms? Symptoms of this condition include:  Pain when bending or straightening your finger.  Tenderness or swelling where your finger attaches to the palm of your hand.  A lump in the palm of your hand or on the inside of your finger.  Hearing a noise like a pop or a snap when you try to straighten  your finger.  Feeling a catching or locking sensation when you try to straighten your finger.  Being unable to straighten your finger. How is this diagnosed? This condition is diagnosed based on your symptoms and a physical exam. How is this treated? This condition may be treated by:  Resting your finger and avoiding activities that make symptoms worse.  Wearing a finger splint to keep your finger extended.  Taking NSAIDs, such as ibuprofen, to relieve pain and swelling.  Doing gentle exercises to stretch the finger as told by your health care provider.  Having medicine that reduces swelling and inflammation (steroids) injected into the tendon sheath. Injections may need to be repeated.  Having surgery to open the tendon sheath. This may be done if other treatments do not work and you cannot straighten your finger. You may need physical therapy after surgery. Follow these instructions at home: If you have a splint:  Wear the splint as told by your health care provider. Remove it only as told by your health care provider.  Loosen it if your fingers tingle, become numb, or turn cold and blue.  Keep it clean.  If the splint is not waterproof: ? Do not let it get wet. ? Cover it with a watertight covering when you take a bath or shower. Managing pain, stiffness, and swelling     Melasma Melasma is a skin condition that causes areas of darker coloring. It usually appears in patches on the  cheeks, forehead, upper lip, and neck. These patches can look like a mask. The discolored areas do not itch and are not red or swollen. Melasma is not contagious. This means that it does not spread from person to person. What are the causes? The cause of this condition is not known. However, it can be started by certain triggers, such as:  Being out in the sun.  Allergies to medicines or cosmetics.  Changes in your hormones, such as: ? Taking birth control medicines. ? Taking hormone  replacement therapy. ? Being pregnant. What increases the risk? The following factors may make you more likely to develop this condition:  Being a woman. Melasma is less common in men.  Having a family history of melasma.  Having darker skin.  Living in a tropical climate. What are the signs or symptoms? The only symptom of this condition is dark or tan patches on the skin. How is this diagnosed? This condition is diagnosed based on:  A physical exam. Your health care provider will examine the physical appearance of your skin. He or she may use an ultraviolet light, called a Wood lamp, to look more closely at your skin.  Biopsy. A small sample of your skin is taken and examined under a microscope. This is done to make sure your melasma is not caused by another skin condition, such as skin cancer. How is this treated? There is no cure for this condition. However, there are treatments that may lighten the color of the darker patches. Treatment may include:  Medicines, such as bleaching or steroid creams.  Facial or chemical peels.  Laser treatment.  Dermabrasion or microdermabrasion. These procedures use fine instruments to scrape and remove the outer layer of skin in order to grow new, healthy-looking skin. Your melasma may also go away on its own over time. Follow these instructions at home:  Lifestyle  Avoid overexposure to the sun, especially in tropical areas.  Wear sunscreen with an SPF of 30 or higher every day.  Wear a hat that protects your face from the sun.  Use gentle cosmetics that are meant for sensitive skin.  Do not use wax to remove excess hair in areas where you have or have had melasma. General instructions  Take or apply over-the-counter and prescription medicines only as told by your health care provider.  Keep all follow-up visits as told by your health care provider. This is important. Contact a health care provider if:  You have new  symptoms.  Your symptoms get worse.  Your affected skin areas are: ? Bleeding. ? Irritated. Summary  Melasma is a skin condition that causes areas of darker coloring that do not itch and are not red or swollen.  The cause of this condition is not known. However, it can be started by certain triggers such as sun exposure, allergies to medicines or cosmetics, or changes in your hormones.  Risk factors include being a woman, having a family history of melasma, having darker skin, or living in a tropical climate.  There is no cure for this condition. However, there are treatments that may lighten the color of the darker patches. They include medicine, facial or chemical peels, laser treatment, dermabrasion, or microdermabrasion. This information is not intended to replace advice given to you by your health care provider. Make sure you discuss any questions you have with your health care provider. Document Revised: 10/30/2017 Document Reviewed: 10/30/2017 Elsevier Patient Education  [employer].  If directed, apply heat  to the affected area as often as told by your health care provider. Use the heat source that your health care provider recommends, such as a moist heat pack or a heating pad.  Place a towel between your skin and the heat source.  Leave the heat on for 20-30 minutes.  Remove the heat if your skin turns bright red. This is especially important if you are unable to feel pain, heat, or cold. You may have a greater risk of getting burned. If directed, put ice on the painful area. To do this:  If you have a removable splint, remove it as told by your health care provider.  Put ice in a plastic bag.  Place a towel between your skin and the bag or between your splint and the bag.  Leave the ice on for 20 minutes, 2-3 times a day.  Activity  Rest your finger as told by your health care provider. Avoid activities that make the pain worse.  Return to your normal  activities as told by your health care provider. Ask your health care provider what activities are safe for you.  Do exercises as told by your health care provider.  Ask your health care provider when it is safe to drive if you have a splint on your hand. General instructions  Take over-the-counter and prescription medicines only as told by your health care provider.  Keep all follow-up visits as told by your health care provider. This is important. Contact a health care provider if:  Your symptoms are not improving with home care. Summary  Trigger finger, also called stenosing tenosynovitis, causes your finger to get stuck in a bent position. This can make it difficult and painful to straighten your finger.  This condition develops when a finger tendon or tendon sheath thickens.  Treatment may include resting your finger, wearing a splint, and taking medicines.  In severe cases, surgery to open the tendon sheath may be needed. This information is not intended to replace advice given to you by your health care provider. Make sure you discuss any questions you have with your health care provider. Document Revised: 03/04/2019 Document Reviewed: 03/04/2019 Elsevier Patient Education  2020 [employer].

## 2020-10-16 NOTE — Progress Notes (Signed)
Subjective  CC:  Chief Complaint  Patient presents with  . trigger finger    Right middle finger  . bone density    Discuss results   . facial discoloration    HPI: Nicole Petty is a 69 y.o. female who presents to the office today to address the problems listed above in the chief complaint.  Patient complains of locking of her right middle finger over the last several weeks.  She will need to open her finger with her left hand.  She also notes pain at the base of the finger.  No injury.  Osteoporosis: Had follow-up bone density in November.  We reviewed the results.  Lowest T score is -2.6 at the spine.  There was mild improvement at the hip.  She has been on Boniva for 3 and half years.  Continues on calcium and vitamin D.  No recent fractures although she did have a patella fracture last year.  She is tolerating the Boniva well.  Complains of facial discoloration bilateral cheeks.  Her friend is using a compound from date to the pharmacy with salicylic acid and Cetaphil.  She would like to try this.  No redness, itching or flaking. Assessment  1. Trigger finger, right middle finger   2. Age-related osteoporosis without current pathological fracture   3. Melasma      Plan   Trigger finger: Education given.  Received steroid injection today.  Recommend rest, splinting, ice.  Follow-up if not improved.  See after visit summary for more education.  Osteoporosis: Stable to mildly worse on Boniva.  Recommend continuing Boniva for total of 5 years.  We can stop in January 2023.  We will then recheck bone density a year after.  Continue calcium and vitamin D and weightbearing exercises  Melasma: Education given.  Salicylic acid and Cetaphil compound cream ordered.  Follow-up as needed.  Follow up: June for complete physical Visit date not found  No orders of the defined types were placed in this encounter.  Meds ordered this encounter  Medications  . Emollient (CETAPHIL) cream     Sig: Apply topically at bedtime as needed.    Dispense:  90 g    Refill:  2    Please dispense compound salicylic acid 8%: cetaphil cream combo. Pt has special request.  . lidocaine (PF) (XYLOCAINE) 1 % injection 0.3 mL  . triamcinolone acetonide (KENALOG-40) injection 25 mg      I reviewed the patients updated PMH, FH, and SocHx.    Patient Active Problem List   Diagnosis Date Noted  . Osteoarthritis of both hands 11/27/2018  . Osteoporosis 09/29/2016   Current Meds  Medication Sig  . b complex vitamins capsule Take by mouth.  Marland Kitchen CALCIUM PO Take 1 tablet by mouth daily.  . Calcium-Vitamin D 500-125 MG-UNIT TABS Take by mouth.  . Cholecalciferol (VITAMIN D PO) Take 1 tablet by mouth daily.   . cycloSPORINE (RESTASIS) 0.05 % ophthalmic emulsion Place 1 drop into both eyes 2 (two) times daily.   Marland Kitchen glucosamine-chondroitin 500-400 MG tablet Take by mouth.  . ibandronate (BONIVA) 150 MG tablet TAKE 1 TABLET BY MOUTH  EVERY MONTH  . Omega-3 Fatty Acids (FISH OIL PO) Take 1 tablet by mouth daily.     Allergies: Patient has No Known Allergies. Family History: Patient family history includes Arthritis in her mother; Hearing loss in her father and mother; Hypertension in her father and mother; Osteoarthritis in an other family member. Social  History:  Patient  reports that she has never smoked. She has never used smokeless tobacco. She reports previous alcohol use. She reports that she does not use drugs.  Review of Systems: Constitutional: Negative for fever malaise or anorexia Cardiovascular: negative for chest pain Respiratory: negative for SOB or persistent cough Gastrointestinal: negative for abdominal pain  Objective  Vitals: BP 110/68   Pulse 72   Temp 98.2 F (36.8 C) (Temporal)   Ht 5\' 1"  (1.549 m)   Wt 129 lb (58.5 kg)   SpO2 97%   BMI 24.37 kg/m  General: no acute distress , A&Ox3 HEENT: PEERL, conjunctiva normal, neck is supple Cardiovascular:  RRR without  murmur or gallop.  Respiratory:  Good breath sounds bilaterally, CTAB with normal respiratory effort Skin:  Warm, no rashes, bilateral hyperpigmentation on bilateral cheeks, no erythema or pustules, no flaking Right middle finger with tenderness over the MCP, no locking currently.  Normal range of motion.  Trigger Finger Steroid Injection Procedure Note  Indications: The patient has symptoms of trigger digit that causes pain and locking.   Pre-operative Diagnosis: right 3rd Trigger Finger  Post-operative Diagnosis: same.   Procedure Details verbal consent for injection was obtained. Risks/benefits discussed.  A Time Out was held and the above information confirmed. The skin was prepped with alcohol and cold spray was use for anesthesia. The symptomatic trigger finger/nodule was injected with 0.75ml of Kenalog 40mg /ml and 0.30ml of 1% lidocaine. No complications.      Commons side effects, risks, benefits, and alternatives for medications and treatment plan prescribed today were discussed, and the patient expressed understanding of the given instructions. Patient is instructed to call or message via MyChart if he/she has any questions or concerns regarding our treatment plan. No barriers to understanding were identified. We discussed Red Flag symptoms and signs in detail. Patient expressed understanding regarding what to do in case of urgent or emergency type symptoms.   Medication list was reconciled, printed and provided to the patient in AVS. Patient instructions and summary information was reviewed with the patient as documented in the AVS. This note was prepared with assistance of Dragon voice recognition software. Occasional wrong-word or sound-a-like substitutions may have occurred due to the inherent limitations of voice recognition software  This visit occurred during the SARS-CoV-2 public health emergency.  Safety protocols were in place, including screening questions prior to the  visit, additional usage of staff PPE, and extensive cleaning of exam room while observing appropriate contact time as indicated for disinfecting solutions.

## 2021-04-09 ENCOUNTER — Ambulatory Visit (INDEPENDENT_AMBULATORY_CARE_PROVIDER_SITE_OTHER): Payer: Medicare Other

## 2021-04-09 ENCOUNTER — Ambulatory Visit (INDEPENDENT_AMBULATORY_CARE_PROVIDER_SITE_OTHER): Payer: Medicare Other | Admitting: Podiatry

## 2021-04-09 ENCOUNTER — Other Ambulatory Visit: Payer: Self-pay | Admitting: *Deleted

## 2021-04-09 ENCOUNTER — Encounter: Payer: Self-pay | Admitting: Podiatry

## 2021-04-09 ENCOUNTER — Other Ambulatory Visit: Payer: Self-pay

## 2021-04-09 DIAGNOSIS — S99922A Unspecified injury of left foot, initial encounter: Secondary | ICD-10-CM

## 2021-04-09 DIAGNOSIS — S99912A Unspecified injury of left ankle, initial encounter: Secondary | ICD-10-CM

## 2021-04-09 DIAGNOSIS — S92355A Nondisplaced fracture of fifth metatarsal bone, left foot, initial encounter for closed fracture: Secondary | ICD-10-CM

## 2021-04-09 NOTE — Progress Notes (Signed)
Subjective: 70 year old female presents the office today for concerns of a left foot injury that occurred yesterday.  She states that she was wearing a shoe with a small heel going down a hill and she twisted her foot and she is unable to apply pressure to the foot given the discomfort.  She has been icing immediately and continues today.  No treatment otherwise.  No other injuries at the time of the incident. Denies any systemic complaints such as fevers, chills, nausea, vomiting. No acute changes since last appointment, and no other complaints at this time.   Objective: AAO x3, NAD DP/PT pulses palpable bilaterally, CRT less than 3 seconds There is tenderness palpation to the left fourth and fifth metatarsals.  Mild diffuse tenderness of the dorsal dorsal midfoot with majority tenderness is pinpoint on the fifth metatarsal mostly.  There is no pain to the ankle.  No proximal tib-fib pain.  No pain with Achilles tendon.  Achilles tendon, flexor, extensor tendons appear to be intact although she was guarding.  There is no deficit within the Achilles tendon. No pain with calf compression, swelling, warmth, erythema  Assessment: Fracture metatarsal  Plan: -All treatment options discussed with the patient including all alternatives, risks, complications.  -X-rays obtained reviewed which did reveal likely fracture of the fifth metatarsal.  Given the x-ray as well as her pain I dispensed a cam boot.  Ice elevation.  She states that she is doing well with ibuprofen we will continue with this.  Prescription for crutches were given to help take some pressure off the foot. -Patient encouraged to call the office with any questions, concerns, change in symptoms.   Return in about 2 weeks (around 04/23/2021).  Repeat x-ray foot next appointment  Vivi Barrack DPM

## 2021-04-09 NOTE — Patient Instructions (Signed)
Walking Boot, Adult  A walking boot holds your foot or ankle in place after an injury or a medical procedure. This helps with healing and prevents further injury. It has a hard, rigid outer frame that limits movement and supports your foot and leg. The inner lining is a layer of padded material. Walking boots also have adjustablestraps to secure them over the foot and leg. A walking boot may be prescribed if you can put weight (bear weight) on your injured foot. How much you can walk while wearing the boot willdepend on the type and severity of your injury. How to put on a walking boot There are different types of walking boots. Each type has specific instructions about how to wear it properly. Follow instructions from your health care provider, such as: Ask someone to help you put on the boot, if needed. Sit to put on your boot. Doing this is more comfortable and helps to prevent falls. Open up the boot fully. Place your foot into the boot so your heel rests against the back. Your toes should be supported by the base of the boot. They should not hang over the front edge. Adjust the straps so the boot fits securely but is not too tight. Do not bend the hard frame of the boot to get a good fit. How to walk with a walking boot How much you can walk will depend on your injury. Some tips for managing with a boot include: Do not try to walk without wearing the boot unless your health care provider approves. Use other assistive walking devices, including crutches or canes, as told by your health care provider. On your uninjured foot, wear a shoe with a heel that is close to the height of the walking boot. Be careful when walking on surfaces that are uneven or wet. How to reduce swelling while using a walking boot  Rest your injured foot or leg as much as possible. If directed, put ice on the injured area. To do this: Put ice in a plastic bag. Place a towel between your skin and the bag. Leave the  ice on for 20 minutes, 2-3 times a day. Remove the ice if your skin turns bright red. This is very important. If you cannot feel pain, heat, or cold, you have a greater risk of damage to the area. Keep your injured foot or leg raised (elevated) above the level of your heart for at least 2?3 hours each day or as told by your health care provider. If swelling gets worse, loosen the boot. Rest and elevate your foot and leg. How to care for your skin and foot while using a walking boot Wear a long sock to protect your foot and leg from rubbing inside the boot. Take off the boot one time each day to check the injured area. Check your foot, the surrounding skin, and your leg to make sure there are no sores, rashes, swelling, or wounds. The skin should be a healthy color, not pale or blue. Try to notice if your walking pattern (gait) in the boot is fairly normal and that you are walking without a noticeable limp. Follow instructions from your health care provider about taking care of your incision or wound, if this applies. Clean and wash the injured area as told by your health care provider. Gently dry your foot and leg before putting the boot back on. Removing your walking boot Follow directions from your health care provider for removing the walking boot.   Generally, it is okay to remove your walking boot: When you are resting or sleeping. To clean your foot and leg. How to keep the walking boot clean Do not put any part of the boot in a washing machine or dryer. Do not use chemical cleaning products. These could irritate your skin, especially if you have a wound or an incision. Do not soak the liner of the boot. Use a washcloth with mild soap and water to clean the frame and the liner of the boot by hand. Allow the boot to air-dry completely before you put it back on your foot. Follow these instructions at home: Activity Your activity will be restricted depending on the type and severity of your  injury. Follow instructions from your health care provider. Also: Bathe and shower as told by your health care provider. Do not do any activities that could make your injury worse. Do not drive if your affected foot is the one that you use for driving. Contact a health care provider if: The boot is cracked or damaged. The boot does not fit properly. Your foot or leg hurts. You have a rash, sore, or open sore (ulcer) on your foot or leg. The skin on your foot or leg is pale. You have a wound or incision on the foot and it is getting worse. Your skin becomes painful, red, or irritated. Your swelling does not get better or it gets worse. Get help right away if: You have numbness in your foot or leg. The skin on your foot or leg is cold, blue, or gray. Summary A walking boot holds your foot or ankle in place after an injury or a medical procedure. There are different types of walking boots. Follow instructions about how to correctly wear your boot. Ask someone to help you put on the boot, if needed. It is important to check your skin and foot every day. Call your health care provider if you notice a rash or sore on your foot or leg. This information is not intended to replace advice given to you by your health care provider. Make sure you discuss any questions you have with your healthcare provider. Document Revised: 08/10/2020 Document Reviewed: 08/10/2020 Elsevier Patient Education  2022 [employer] Metatarsal Fracture A metatarsal fracture is a break in one of the five bones that connect the toes to the rest of the foot. This may also be called a forefoot fracture. A metatarsal fracture may be: A crack in the surface of the bone (stress fracture). This often occurs in athletes. A break all the way through the bone (complete fracture). The bone that connects to the little toe (fifth metatarsal) is most commonly fractured. Ballet dancers often fracture this bone. What are the causes? A  metatarsal fracture may be caused by: Sudden twisting of the foot. Falling onto the foot. Something heavy falling onto the foot. Overuse or repetitive exercise. What increases the risk? This condition is more likely to develop in people who: Play contact sports. Do ballet. Have a condition that causes the bones to become thin and brittle (osteoporosis). Have a low calcium level. What are the signs or symptoms? Symptoms of this condition include: Pain that gets worse when walking or standing. Pain when pressing on the foot or moving the toes. Swelling. Bruising on the top or bottom of the foot. How is this diagnosed? This condition may be diagnosed based on: Your symptoms. Any recent foot injuries you have had. A physical exam. An X-ray of  your foot. If you have a stress fracture, it may not show up on an X-ray, and you may need other imaging tests, such as: A bone scan. CT scan. MRI. How is this treated? Treatment depends on how severe your fracture is and how the pieces of the broken bone line up with each other (alignment). Treatment may involve: Wearing a cast, splint, or supportive boot on your foot. Using crutches, and not putting any weight on your foot. Having surgery to align broken bones (open reduction and internal fixation, ORIF). Physical therapy. Follow-up visits and X-rays to make sure you are healing. Follow these instructions at home: If you have a splint or a supportive boot: Wear the splint or boot as told by your health care provider. Remove it only as told by your health care provider. Loosen the splint or boot if your toes tingle, become numb, or turn cold and blue. Keep the splint or boot clean. If your splint or boot is not waterproof: Do not let it get wet. Cover it with a watertight covering when you take a bath or a shower. If you have a cast: Do not stick anything inside the cast to scratch your skin. Doing that increases your risk for  infection. Check the skin around the cast every day. Tell your health care provider about any concerns. You may put lotion on dry skin around the edges of the cast. Do not put lotion on the skin underneath the cast. Keep the cast clean. If the cast is not waterproof: Do not let it get wet. Cover it with a watertight covering when you take a bath or a shower. Activity Do not use your affected leg to support your body weight until your health care provider says that you can. Use crutches as directed. Ask your health care provider what activities are safe for you during recovery, and ask what activities you need to avoid. Do physical therapy exercises as directed. Driving Do not drive or use heavy machinery while taking pain medicine. Do not drive while wearing a cast, splint, or boot on a foot that you use for driving. Managing pain, stiffness, and swelling  If directed, put ice on painful areas: Put ice in a plastic bag. Place a towel between your skin and the bag. If you have a removable splint or boot, remove it as told by your health care provider. If you have a cast, place a towel between your cast and the bag. Leave the ice on for 20 minutes, 2-3 times a day. Move your toes often to avoid stiffness and to lessen swelling. Raise (elevate) your lower leg above the level of your heart while you are sitting or lying down.  General instructions Do not put pressure on any part of the cast or splint until it is fully hardened. This may take several hours. Take over-the-counter and prescription medicines only as told by your health care provider. Do not use any products that contain nicotine or tobacco, such as cigarettes and e-cigarettes. These can delay bone healing. If you need help quitting, ask your health care provider. Do not take baths, swim, or use a hot tub until your health care provider approves. Ask your health care provider if you may take showers. Keep all follow-up visits as  told by your health care provider. This is important. Contact a health care provider if you have: Pain that gets worse or does not get better with medicine. A fever. A bad smell coming from your  cast or splint. Get help right away if you have: Any of the following in your toes or your foot, even after loosening your splint (if applicable): Numbness. Tingling. Coldness. Blue skin. Redness or swelling that gets worse. Pain that suddenly becomes severe. Summary A metatarsal fracture is a break in one of the five bones that connect the toes to the rest of the foot. Treatment depends on how severe your fracture is and how the pieces of the broken bone line up with each other (alignment). This may include wearing a cast, splint, or supportive boot, or using crutches. Sometimes surgery is needed to align the bones. Ice and elevate your foot to help lessen the pain and swelling. Make sure you know what symptoms should cause you to get help right away. This information is not intended to replace advice given to you by your health care provider. Make sure you discuss any questions you have with your healthcare provider. Document Revised: 02/07/2019 Document Reviewed: 11/13/2017 Elsevier Patient Education  2022 [employer].

## 2021-04-28 ENCOUNTER — Encounter: Payer: Medicare Other | Admitting: Family Medicine

## 2021-05-04 ENCOUNTER — Ambulatory Visit: Payer: Medicare Other | Admitting: Podiatry

## 2021-06-12 ENCOUNTER — Other Ambulatory Visit: Payer: Self-pay | Admitting: Family Medicine

## 2021-07-12 ENCOUNTER — Encounter: Payer: Self-pay | Admitting: Family Medicine

## 2021-07-12 ENCOUNTER — Ambulatory Visit (INDEPENDENT_AMBULATORY_CARE_PROVIDER_SITE_OTHER): Payer: Medicare Other | Admitting: Family Medicine

## 2021-07-12 ENCOUNTER — Other Ambulatory Visit: Payer: Self-pay

## 2021-07-12 VITALS — BP 116/70 | HR 68 | Temp 97.6°F | Ht 61.0 in | Wt 128.2 lb

## 2021-07-12 DIAGNOSIS — Z0000 Encounter for general adult medical examination without abnormal findings: Secondary | ICD-10-CM

## 2021-07-12 DIAGNOSIS — Z23 Encounter for immunization: Secondary | ICD-10-CM

## 2021-07-12 DIAGNOSIS — Z0001 Encounter for general adult medical examination with abnormal findings: Secondary | ICD-10-CM | POA: Diagnosis not present

## 2021-07-12 DIAGNOSIS — M810 Age-related osteoporosis without current pathological fracture: Secondary | ICD-10-CM

## 2021-07-12 DIAGNOSIS — M19041 Primary osteoarthritis, right hand: Secondary | ICD-10-CM | POA: Diagnosis not present

## 2021-07-12 DIAGNOSIS — M65331 Trigger finger, right middle finger: Secondary | ICD-10-CM

## 2021-07-12 DIAGNOSIS — Z8616 Personal history of COVID-19: Secondary | ICD-10-CM

## 2021-07-12 DIAGNOSIS — M545 Low back pain, unspecified: Secondary | ICD-10-CM

## 2021-07-12 DIAGNOSIS — M19042 Primary osteoarthritis, left hand: Secondary | ICD-10-CM

## 2021-07-12 LAB — CBC WITH DIFFERENTIAL/PLATELET
Basophils Absolute: 0 10*3/uL (ref 0.0–0.1)
Basophils Relative: 0.4 % (ref 0.0–3.0)
Eosinophils Absolute: 0.1 10*3/uL (ref 0.0–0.7)
Eosinophils Relative: 2.2 % (ref 0.0–5.0)
HCT: 40.3 % (ref 36.0–46.0)
Hemoglobin: 13.3 g/dL (ref 12.0–15.0)
Lymphocytes Relative: 22.5 % (ref 12.0–46.0)
Lymphs Abs: 0.8 10*3/uL (ref 0.7–4.0)
MCHC: 32.9 g/dL (ref 30.0–36.0)
MCV: 94.2 fl (ref 78.0–100.0)
Monocytes Absolute: 0.3 10*3/uL (ref 0.1–1.0)
Monocytes Relative: 8.6 % (ref 3.0–12.0)
Neutro Abs: 2.3 10*3/uL (ref 1.4–7.7)
Neutrophils Relative %: 66.3 % (ref 43.0–77.0)
Platelets: 234 10*3/uL (ref 150.0–400.0)
RBC: 4.27 Mil/uL (ref 3.87–5.11)
RDW: 14.2 % (ref 11.5–15.5)
WBC: 3.5 10*3/uL — ABNORMAL LOW (ref 4.0–10.5)

## 2021-07-12 LAB — URINALYSIS, ROUTINE W REFLEX MICROSCOPIC
Bilirubin Urine: NEGATIVE
Hgb urine dipstick: NEGATIVE
Ketones, ur: NEGATIVE
Leukocytes,Ua: NEGATIVE
Nitrite: NEGATIVE
RBC / HPF: NONE SEEN (ref 0–?)
Specific Gravity, Urine: 1.01 (ref 1.000–1.030)
Total Protein, Urine: NEGATIVE
Urine Glucose: NEGATIVE
Urobilinogen, UA: 0.2 (ref 0.0–1.0)
WBC, UA: NONE SEEN (ref 0–?)
pH: 6.5 (ref 5.0–8.0)

## 2021-07-12 LAB — COMPREHENSIVE METABOLIC PANEL
ALT: 15 U/L (ref 0–35)
AST: 21 U/L (ref 0–37)
Albumin: 4.3 g/dL (ref 3.5–5.2)
Alkaline Phosphatase: 58 U/L (ref 39–117)
BUN: 19 mg/dL (ref 6–23)
CO2: 30 mEq/L (ref 19–32)
Calcium: 10.2 mg/dL (ref 8.4–10.5)
Chloride: 105 mEq/L (ref 96–112)
Creatinine, Ser: 0.84 mg/dL (ref 0.40–1.20)
GFR: 71.27 mL/min (ref >60.00)
Glucose, Bld: 83 mg/dL (ref 70–99)
Potassium: 4.4 mEq/L (ref 3.5–5.1)
Sodium: 142 mEq/L (ref 135–145)
Total Bilirubin: 0.4 mg/dL (ref 0.2–1.2)
Total Protein: 7 g/dL (ref 6.0–8.3)

## 2021-07-12 LAB — TSH: TSH: 2 u[IU]/mL (ref 0.35–5.50)

## 2021-07-12 LAB — LIPID PANEL
Cholesterol: 206 mg/dL — ABNORMAL HIGH (ref 0–200)
HDL: 78.4 mg/dL (ref >39.00)
LDL Cholesterol: 109 mg/dL — ABNORMAL HIGH (ref 0–99)
NonHDL: 127.58
Total CHOL/HDL Ratio: 3
Triglycerides: 93 mg/dL (ref 0.0–149.0)
VLDL: 18.6 mg/dL (ref 0.0–40.0)

## 2021-07-12 LAB — VITAMIN D 25 HYDROXY (VIT D DEFICIENCY, FRACTURES): VITD: 43.65 ng/mL (ref 30.00–100.00)

## 2021-07-12 LAB — HEMOGLOBIN A1C: Hgb A1c MFr Bld: 5.8 % (ref 4.6–6.5)

## 2021-07-12 NOTE — Patient Instructions (Signed)
Please return in 12 months for your annual complete physical; please come fasting.   I will release your lab results to you on your MyChart account with further instructions. Please reply with any questions.    Today you were given your flu vaccination.    We will call you with an appointment to see Dr. Amanda Pea to check your finger.  Use ibuprofen, 2 tabs twice a day with food for a week for your back and hand pain. The exercises for your back are below.   If you have any questions or concerns, please don't hesitate to send me a message via MyChart or call the office at [phone]. Thank you for visiting with [place] today! It's our pleasure caring for you.   Back Exercises The following exercises strengthen the muscles that help to support the trunk (torso) and back. They also help to keep the lower back flexible. Doing these exercises can help to prevent or lessen existing low back pain. If you have back pain or discomfort, try doing these exercises 2-3 times each day or as told by your health care provider. As your pain improves, do them once each day, but increase the number of times that you repeat the steps for each exercise (do more repetitions). To prevent the recurrence of back pain, continue to do these exercises once each day or as told by your health care provider. Do exercises exactly as told by your health care provider and adjust them as directed. It is normal to feel mild stretching, pulling, tightness, or discomfort as you do these exercises, but you should stop right away if you feel sudden pain or your pain gets worse. Exercises Single knee to chest Repeat these steps 3-5 times for each leg: Lie on your back on a firm bed or the floor with your legs extended. Bring one knee to your chest. Your other leg should stay extended and in contact with the floor. Hold your knee in place by grabbing your knee or thigh with both hands and hold. Pull on your knee until you feel a gentle  stretch in your lower back or buttocks. Hold the stretch for 10-30 seconds. Slowly release and straighten your leg. Pelvic tilt Repeat these steps 5-10 times: Lie on your back on a firm bed or the floor with your legs extended. Bend your knees so they are pointing toward the ceiling and your feet are flat on the floor. Tighten your lower abdominal muscles to press your lower back against the floor. This motion will tilt your pelvis so your tailbone points up toward the ceiling instead of pointing to your feet or the floor. With gentle tension and even breathing, hold this position for 5-10 seconds. Cat-cow Repeat these steps until your lower back becomes more flexible: Get into a hands-and-knees position on a firm bed or the floor. Keep your hands under your shoulders, and keep your knees under your hips. You may place padding under your knees for comfort. Let your head hang down toward your chest. Contract your abdominal muscles and point your tailbone toward the floor so your lower back becomes rounded like the back of a cat. Hold this position for 5 seconds. Slowly lift your head, let your abdominal muscles relax, and point your tailbone up toward the ceiling so your back forms a sagging arch like the back of a cow. Hold this position for 5 seconds.  Press-ups Repeat these steps 5-10 times: Lie on your abdomen (face-down) on a firm bed  or the floor. Place your palms near your head, about shoulder-width apart. Keeping your back as relaxed as possible and keeping your hips on the floor, slowly straighten your arms to raise the top half of your body and lift your shoulders. Do not use your back muscles to raise your upper torso. You may adjust the placement of your hands to make yourself more comfortable. Hold this position for 5 seconds while you keep your back relaxed. Slowly return to lying flat on the floor.  Bridges Repeat these steps 10 times: Lie on your back on a firm bed or the  floor. Bend your knees so they are pointing toward the ceiling and your feet are flat on the floor. Your arms should be flat at your sides, next to your body. Tighten your buttocks muscles and lift your buttocks off the floor until your waist is at almost the same height as your knees. You should feel the muscles working in your buttocks and the back of your thighs. If you do not feel these muscles, slide your feet 1-2 inches (2.5-5 cm) farther away from your buttocks. Hold this position for 3-5 seconds. Slowly lower your hips to the starting position, and allow your buttocks muscles to relax completely. If this exercise is too easy, try doing it with your arms crossed over your chest. Abdominal crunches Repeat these steps 5-10 times: Lie on your back on a firm bed or the floor with your legs extended. Bend your knees so they are pointing toward the ceiling and your feet are flat on the floor. Cross your arms over your chest. Tip your chin slightly toward your chest without bending your neck. Tighten your abdominal muscles and slowly raise your torso high enough to lift your shoulder blades a tiny bit off the floor. Avoid raising your torso higher than that because it can put too much stress on your lower back and does not help to strengthen your abdominal muscles. Slowly return to your starting position. Back lifts Repeat these steps 5-10 times: Lie on your abdomen (face-down) with your arms at your sides, and rest your forehead on the floor. Tighten the muscles in your legs and your buttocks. Slowly lift your chest off the floor while you keep your hips pressed to the floor. Keep the back of your head in line with the curve in your back. Your eyes should be looking at the floor. Hold this position for 3-5 seconds. Slowly return to your starting position. Contact a health care provider if: Your back pain or discomfort gets much worse when you do an exercise. Your worsening back pain or  discomfort does not lessen within 2 hours after you exercise. If you have any of these problems, stop doing these exercises right away. Do not do them again unless your health care provider says that you can. Get help right away if: You develop sudden, severe back pain. If this happens, stop doing the exercises right away. Do not do them again unless your health care provider says that you can. This information is not intended to replace advice given to you by your health care provider. Make sure you discuss any questions you have with your health care provider. Document Revised: 12/30/2020 Document Reviewed: 12/30/2020 Elsevier Patient Education  2022 [employer].

## 2021-07-12 NOTE — Progress Notes (Signed)
Subjective  Chief Complaint  Patient presents with   Annual Exam    Not fasting   Osteoporosis    HPI: Nicole Petty is a 70 y.o. female who presents to [facility] [place] today for a Female Wellness Visit. She also has the concerns and/or needs as listed above in the chief complaint. These will be addressed in addition to the Health Maintenance Visit.   Wellness Visit: annual visit with health maintenance review and exam without Pap  HM: mammo due in November. Overall continues to do well. Healthy lifestyle. Eligible for flu vaccine and covid booster, 4th. Other imms up to date. Due eye exam.  Chronic disease f/u and/or acute problem visit: (deemed necessary to be done in addition to the wellness visit): Hand OA and h/o trigger finger, right 3rd digit: did very well after steroid injection in December of last year. Now with recurrent pain. Has done home exercises. Rare locking now. Also with joint pain due to OA in hands. Uses voltaren gel almost daily. Helpful.  Osteoporosis: on boniva, this is year 5. Reviewed dexa from last November : stable.  C/o intermittent, 2-3x/week,sharp catching low back pain midline. No radicular sxs or chronic aching. No injury. No b/b sxs. Never has needed medication for pain. The sharp pain is mild and fleeting. Typically positional. Doesn't affect sleep. No h/o back xrays. Nl gait problems.  Had mild covid infection in January. Is vaccinated. No residual sxs. Had mild fever and sore throat only.  Ros: sees bubbles in her urine at times. +left tremor in hand when holding heavy objects.   Assessment  1. Annual physical exam   2. Age-related osteoporosis without current pathological fracture   3. Primary osteoarthritis of both hands   4. Trigger finger, right middle finger   5. Low back pain, episodic   6. Personal history of COVID-19      Plan  Female Wellness Visit: Age appropriate Health Maintenance and Prevention measures were  discussed with patient. Included topics are cancer screening recommendations, ways to keep healthy (see AVS) including dietary and exercise recommendations, regular eye and dental care, use of seat belts, and avoidance of moderate alcohol use and tobacco use. Mammo in November. Screen with UA for CKD. Dexa repeat next year. Rec eye exam.  BMI: discussed patient's BMI and encouraged positive lifestyle modifications to help get to or maintain a target BMI. HM needs and immunizations were addressed and ordered. See below for orders. See HM and immunization section for updates. Flu shot today Routine labs and screening tests ordered including cmp, cbc and lipids where appropriate. Discussed recommendations regarding Vit D and calcium supplementation (see AVS)  Chronic disease management visit and/or acute problem visit: Osteoarthritis of hands and trigger finger: possible early contracture present vs OA change at PIP of 3rd joint right hand; refer to Dr. Amanda Pea for evaluation. Still with mild pain no locking. Stretching and voltaren gel. May use tylenol for OA as needed. Osteoporosis: stable. Continue boniva and recheck dexa next year. Will have completed 5+ years of boniva; will rec drug holiday if dexa is stable at that time.  Low back pain: c/w MSK, mild. No red flags. Advil bid x 1 week and home exercises. See AVS H/o covid: resolved. Will get 4th booster soon  Follow up: Return in about 1 year (around 07/12/2022) for complete physical.  Orders Placed This Encounter  Procedures   CBC with Differential/Platelet   Comprehensive metabolic panel   Lipid  panel   TSH   VITAMIN D 25 Hydroxy (Vit-D Deficiency, Fractures)   Urinalysis, Routine w reflex microscopic   Hemoglobin A1c   Ambulatory referral to Orthopedics   No orders of the defined types were placed in this encounter.     Body mass index is 24.22 kg/m. Wt Readings from Last 3 Encounters:  07/12/21 128 lb 3.2 oz (58.2 kg)  10/16/20  129 lb (58.5 kg)  04/27/20 131 lb 3.2 oz (59.5 kg)     Patient Active Problem List   Diagnosis Date Noted   Osteoarthritis of both hands 11/27/2018    1st mcps    Osteoporosis 09/29/2016    DEXA: 08/2020; lowest T = -2.6 spine. Osteoporosis persists on boniva since 2017. rec continuation through 2022 and recheck DEXA in 2023. Drug holiday startin 10/2021.  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  Osteopenia at multiple sites (improved on boniva).. Follow up in 2 years. R femur neck T-Score: -2.10 L femur T-Score: -1.90 R Total femur T-Score: -1.60 L Total femur T-Score: -1.90 AP Total Spine T-Score: -2.40    Health Maintenance  Topic Date Due   COVID-19 Vaccine (4 - Booster for Pfizer series) 01/01/2021   INFLUENZA VACCINE  05/31/2021   MAMMOGRAM  09/14/2021   Fecal DNA (Cologuard)  08/04/2022   DEXA SCAN  09/14/2022   TETANUS/TDAP  05/28/2028   Hepatitis C Screening  Completed   PNA vac Low Risk Adult  Completed   Zoster Vaccines- Shingrix  Completed   HPV VACCINES  Aged Out   Immunization History  Administered Date(s) Administered   Fluad Quad(high Dose 65+) 07/23/2019, 07/31/2020   Influenza-Unspecified 11/06/2018   PFIZER(Purple Top)SARS-COV-2 Vaccination 11/28/2019, 12/20/2019, 09/03/2020   Pneumococcal Conjugate-13 11/28/2018   Pneumococcal Polysaccharide-23 04/27/2020   Tdap 08/29/2007   Zoster Recombinat (Shingrix) 03/06/2018, 06/18/2018   Zoster, Live 04/08/2013   We updated and reviewed the patient's past history in detail and it is documented below. Allergies: Patient has No Known Allergies. Past Medical History Patient  has a past medical history of Osteoarthritis, Osteopenia, Osteoporosis, Vitamin D deficiency, and Xerosis cutis. Past Surgical History Patient  has no past surgical history on file. Family History: Patient family history includes  Arthritis in her mother; Hearing loss in her father and mother; Hypertension in her father and mother; Osteoarthritis in an other family member. Social History:  Patient  reports that she has never smoked. She has never used smokeless tobacco. She reports that she does not currently use alcohol. She reports that she does not use drugs.  Review of Systems: Constitutional: negative for fever or malaise Ophthalmic: negative for photophobia, double vision or loss of vision Cardiovascular: negative for chest pain, dyspnea on exertion, or new LE swelling Respiratory: negative for SOB or persistent cough Gastrointestinal: negative for abdominal pain, change in bowel habits or melena Genitourinary: negative for dysuria or gross hematuria, no abnormal uterine bleeding or disharge Musculoskeletal: negative for new gait disturbance or muscular weakness Integumentary: negative for new or persistent rashes, no breast lumps Neurological: negative for TIA or stroke symptoms Psychiatric: negative for SI or delusions Allergic/Immunologic: negative for hives  Patient Care Team    Relationship Specialty Notifications Start End  Willow Ora, MD PCP - General Family Medicine  11/27/18   Charna Elizabeth, MD Consulting Physician Gastroenterology  11/27/18   Janet Berlin, MD Consulting Physician Ophthalmology  11/27/18   Juluis Pitch,  DDS  Dentistry  11/27/18   Samson Frederic, MD Consulting Physician Orthopedic Surgery  07/23/19     Objective  Vitals: BP 116/70   Pulse 68   Temp 97.6 F (36.4 C) (Temporal)   Ht 5\' 1"  (1.549 m)   Wt 128 lb 3.2 oz (58.2 kg)   SpO2 98%   BMI 24.22 kg/m  General:  Well developed, well nourished, no acute distress  Psych:  Alert and orientedx3,normal mood and affect HEENT:  Normocephalic, atraumatic, non-icteric sclera,  supple neck without adenopathy, mass or thyromegaly Cardiovascular:  Normal S1, S2, RRR without gallop, rub or murmur Respiratory:  Good breath sounds  bilaterally, CTAB with normal respiratory effort Gastrointestinal: normal bowel sounds, soft, non-tender, no noted masses. No HSM MSK: OA changes in hands. Ttp MCP 3rd joint right. No locking. Joints are without erythema or swelling.  Skin:  Warm, no rashes or suspicious lesions noted Neurologic:    Mental status is normal. CN 2-11 are normal. Gross motor and sensory exams are normal. Normal gait. Fine tremor on left noted Breast Exam: No mass, skin retraction or nipple discharge is appreciated in either breast. No axillary adenopathy. Fibrocystic changes are not noted   Commons side effects, risks, benefits, and alternatives for medications and treatment plan prescribed today were discussed, and the patient expressed understanding of the given instructions. Patient is instructed to call or message via MyChart if he/she has any questions or concerns regarding our treatment plan. No barriers to understanding were identified. We discussed Red Flag symptoms and signs in detail. Patient expressed understanding regarding what to do in case of urgent or emergency type symptoms.  Medication list was reconciled, printed and provided to the patient in AVS. Patient instructions and summary information was reviewed with the patient as documented in the AVS. This note was prepared with assistance of Dragon voice recognition software. Occasional wrong-word or sound-a-like substitutions may have occurred due to the inherent limitations of voice recognition software  This visit occurred during the SARS-CoV-2 public health emergency.  Safety protocols were in place, including screening questions prior to the visit, additional usage of staff PPE, and extensive cleaning of exam room while observing appropriate contact time as indicated for disinfecting solutions.

## 2021-09-06 ENCOUNTER — Encounter: Payer: Self-pay | Admitting: Family Medicine

## 2021-09-09 ENCOUNTER — Ambulatory Visit (INDEPENDENT_AMBULATORY_CARE_PROVIDER_SITE_OTHER): Payer: Medicare Other | Admitting: Sports Medicine

## 2021-09-09 VITALS — BP 116/70 | Ht 61.0 in | Wt 128.0 lb

## 2021-09-09 DIAGNOSIS — M6530 Trigger finger, unspecified finger: Secondary | ICD-10-CM | POA: Diagnosis not present

## 2021-09-09 DIAGNOSIS — M7711 Lateral epicondylitis, right elbow: Secondary | ICD-10-CM | POA: Diagnosis not present

## 2021-09-09 NOTE — Patient Instructions (Signed)
Dr. Mina Marble [facility] [address], [place] [address] Ph: [phone]

## 2021-09-09 NOTE — Progress Notes (Signed)
   Subjective:    Patient ID: Nicole Petty, female    DOB: Sep 19, 1951, 70 y.o.   MRN: [id number]  HPI chief complaint: Right arm pain  Very pleasant right-hand-dominant female comes in today complaining of right arm pain that is primarily along the lateral right elbow.  She has had pain for 2 to 3 months.  She denies any trauma.  Pain is most noticeable when opening jars or cans.  She also describes some shoulder pain around the scapular area but nothing around the lateral or anterior shoulder.  She is also dealing with a trigger finger in her middle finger on the same hand.  She has previously seen a hand specialist and had injected with cortisone which was minimally helpful.  Finger typically locks while sleeping at night.  No prior surgeries to her right shoulder, elbow, or hand.  Past medical history reviewed Medications reviewed Allergies reviewed    Review of Systems As above    Objective:   Physical Exam  Well-developed, well-nourished.  No acute distress  Right shoulder: Full painless shoulder range of motion.  Negative painful arc.  She is tender to palpation around the periscapular area but no scapular winging.  Rotator cuff strength is 5/5 and does not reproduce pain.  Negative empty can, negative Hawkins.  Negative O'Brien's.  Right elbow: She is tender to palpation directly over the lateral epicondyle.  Full range of motion.  No effusion.  No soft tissue swelling.  There is reproducible pain with ECRB testing.  Right hand: There is active triggering of the right middle finger.  No soft tissue swelling.  Palpable nodule in the area of the A1 pulley of this finger.  Brisk capillary refill.      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  She has some topical Voltaren which she has been  using on her trigger finger and I recommended that she apply this to her elbow twice a day as well.  I did discuss the possibility of formal physical therapy if this is ineffective.  I also provided her with the name of Dr. Mina Marble just in case she would like to see other specialist for her trigger finger.  Follow-up with me as needed.  This note was dictated using Dragon naturally speaking software and may contain errors in syntax, spelling, or content which have not been identified prior to signing this note.

## 2021-09-20 LAB — HM MAMMOGRAPHY

## 2021-09-22 ENCOUNTER — Encounter: Payer: Self-pay | Admitting: Family Medicine

## 2021-10-07 LAB — HM MAMMOGRAPHY

## 2021-10-08 ENCOUNTER — Encounter: Payer: Self-pay | Admitting: Family Medicine

## 2021-11-23 ENCOUNTER — Other Ambulatory Visit: Payer: Self-pay | Admitting: Family Medicine

## 2021-11-23 ENCOUNTER — Other Ambulatory Visit: Payer: Self-pay

## 2021-11-23 MED ORDER — IBANDRONATE SODIUM 150 MG PO TABS: ORAL_TABLET | ORAL | 0 refills | Status: DC

## 2022-01-29 ENCOUNTER — Encounter: Payer: Self-pay | Admitting: Family Medicine

## 2022-01-30 ENCOUNTER — Telehealth: Payer: Medicare Other | Admitting: Family

## 2022-01-30 DIAGNOSIS — U071 COVID-19: Secondary | ICD-10-CM

## 2022-01-30 MED ORDER — MOLNUPIRAVIR EUA 200MG CAPSULE: 4.0000 | ORAL_CAPSULE | Freq: Two times a day (BID) | ORAL | 0 refills | Status: AC

## 2022-01-30 NOTE — Progress Notes (Signed)
?Virtual Visit Consent  ? ?Nicole Petty, you are scheduled for a virtual visit with a [facility] provider today.   ?  ?Just as with appointments in the office, your consent must be obtained to participate.  Your consent will be active for this visit and any virtual visit you may have with one of our providers in the next 365 days.   ?  ?If you have a MyChart account, a copy of this consent can be sent to you electronically.  All virtual visits are billed to your insurance company just like a traditional visit in the office.   ? ?As this is a virtual visit, video technology does not allow for your provider to perform a traditional examination.  This may limit your provider's ability to fully assess your condition.  If your provider identifies any concerns that need to be evaluated in person or the need to arrange testing (such as labs, EKG, etc.), we will make arrangements to do so.   ?  ?Although advances in technology are sophisticated, we cannot ensure that it will always work on either your end or our end.  If the connection with a video visit is poor, the visit may have to be switched to a telephone visit.  With either a video or telephone visit, we are not always able to ensure that we have a secure connection.    ? ?I need to obtain your verbal consent now.   Are you willing to proceed with your visit today?  ?  ?Nicole Petty has provided verbal consent on 01/30/2022 for a virtual visit (video or telephone). ?  ?Nicole Rodney, FNP  ? ?Date: 01/30/2022 8:25 AM ? ? ?Virtual Visit via Video Note  ? ?Nicole Petty, connected with  Nicole Petty  ([id number], 09/10/1951) on 01/30/22 at  8:30 AM EDT by a video-enabled telemedicine application and verified that I am speaking with the correct person using two identifiers. ? ?Location: ?Patient: Virtual Visit Location Patient: Home ?Provider: Virtual Visit Location Provider: Home Office ?  ?I discussed the limitations of evaluation and management by telemedicine and  the availability of in person appointments. The patient expressed understanding and agreed to proceed.   ? ?History of Present Illness: ?Nicole Petty is a 71 y.o. who identifies as a female who was assigned female at birth, and is being seen today for COVID. She reports her symptoms started yesterday and tested positive this morning.  ? ?HPI: Sore Throat  ?This is a new problem. The current episode started yesterday. The problem has been gradually worsening. Maximum temperature: 99.4 F. Associated symptoms include congestion, coughing and headaches. Pertinent negatives include no ear pain or shortness of breath. Associated symptoms comments: Fatigue, rhinorrhea, diarrhea, sneezing  ?Marland Kitchen She has tried acetaminophen for the symptoms. The treatment provided mild relief.   ?Problems:  ?Patient Active Problem List  ? Diagnosis Date Noted  ? Osteoarthritis of both hands 11/27/2018  ? Osteoporosis 09/29/2016  ?  ?Allergies: No Known Allergies ?Medications:  ?Current Outpatient Medications:  ?  molnupiravir EUA (LAGEVRIO) 200 mg CAPS capsule, Take 4 capsules (800 mg total) by mouth 2 (two) times daily for 5 days., Disp: 40 capsule, Rfl: 0 ?  b complex vitamins capsule, Take by mouth., Disp: , Rfl:  ?  CALCIUM PO, Take 1 tablet by mouth daily., Disp: , Rfl:  ?  Calcium-Vitamin D 500-125 MG-UNIT TABS, Take by mouth., Disp: , Rfl:  ?  Cholecalciferol (VITAMIN D PO), Take  1 tablet by mouth daily. , Disp: , Rfl:  ?  COLLAGEN PO, Take by mouth., Disp: , Rfl:  ?  cycloSPORINE (RESTASIS) 0.05 % ophthalmic emulsion, Place 1 drop into both eyes 2 (two) times daily. , Disp: , Rfl:  ?  glucosamine-chondroitin 500-400 MG tablet, Take by mouth., Disp: , Rfl:  ?  ibandronate (BONIVA) 150 MG tablet, TAKE 1 TABLET BY MOUTH  EVERY  MONTH, Disp: 3 tablet, Rfl: 3 ?  ibandronate (BONIVA) 150 MG tablet, Take in the morning with a full glass of water, on an empty stomach, and do not take anything else by mouth or lie down for the next 30 min.,  Disp: 3 tablet, Rfl: 0 ?  Omega-3 Fatty Acids (FISH OIL PO), Take 1 tablet by mouth daily. , Disp: , Rfl:  ? ?Observations/Objective: ?Patient is well-developed, well-nourished in no acute distress.  ?Resting comfortably  at home.  ?Head is normocephalic, atraumatic.  ?No labored breathing.  ?Speech is clear and coherent with logical content.  ?Patient is alert and oriented at baseline.  ?Nasal congestion ? ?Assessment and Plan: ?1. COVID-19 ?- molnupiravir EUA (LAGEVRIO) 200 mg CAPS capsule; Take 4 capsules (800 mg total) by mouth 2 (two) times daily for 5 days.  Dispense: 40 capsule; Refill: 0 ? ?COVID positive, rest, force fluids, tylenol as needed, Quarantine for at least 5 days and you are fever free, then must wear a mask out in public from day [id number], report any worsening symptoms such as increased shortness of breath, swelling, or continued high fevers. Possible adverse effects discussed with antivirals.  ? ? ?Follow Up Instructions: ?I discussed the assessment and treatment plan with the patient. The patient was provided an opportunity to ask questions and all were answered. The patient agreed with the plan and demonstrated an understanding of the instructions.  A copy of instructions were sent to the patient via MyChart unless otherwise noted below.  ? ? ? ?The patient was advised to call back or seek an in-person evaluation if the symptoms worsen or if the condition fails to improve as anticipated. ? ?Time:  ?I spent 9 minutes with the patient via telehealth technology discussing the above problems/concerns.   ? ?Nicole Dun, FNP ? ?

## 2022-01-30 NOTE — Patient Instructions (Signed)
10 Things You Can Do to Manage Your COVID-19 Symptoms at Home ?If you have possible or confirmed COVID-19 ?Stay home except to get medical care. ?Monitor your symptoms carefully. If your symptoms get worse, call your healthcare provider immediately. ?Get rest and stay hydrated. ?If you have a medical appointment, call the healthcare provider ahead of time and tell them that you have or may have COVID-19. ?For medical emergencies, call 911 and notify the dispatch personnel that you have or may have COVID-19. ?Cover your cough and sneezes with a tissue or use the inside of your elbow. ?Wash your hands often with soap and water for at least 20 seconds or clean your hands with an alcohol-based hand sanitizer that contains at least 60% alcohol. ?As much as possible, stay in a specific room and away from other people in your home. Also, you should use a separate bathroom, if available. If you need to be around other people in or outside of the home, wear a mask. ?Avoid sharing personal items with other people in your household, like dishes, towels, and bedding. ?Clean all surfaces that are touched often, like counters, tabletops, and doorknobs. Use household cleaning sprays or wipes according to the label instructions. ?cdc.gov/coronavirus ?05/15/2020 ?This information is not intended to replace advice given to you by your health care provider. Make sure you discuss any questions you have with your health care provider. ?Document Revised: 07/09/2021 Document Reviewed: 07/09/2021 ?Elsevier Patient Education ? 2022 [employer] ? ?

## 2022-02-07 ENCOUNTER — Telehealth: Payer: Medicare Other | Admitting: Family Medicine

## 2022-02-07 DIAGNOSIS — U071 COVID-19: Secondary | ICD-10-CM

## 2022-02-07 DIAGNOSIS — R051 Acute cough: Secondary | ICD-10-CM | POA: Diagnosis not present

## 2022-02-07 MED ORDER — GUAIFENESIN ER 600 MG PO TB12: 600.0000 mg | ORAL_TABLET | Freq: Two times a day (BID) | ORAL | 0 refills | Status: AC

## 2022-02-07 MED ORDER — BENZONATATE 100 MG PO CAPS: 100.0000 mg | ORAL_CAPSULE | Freq: Three times a day (TID) | ORAL | 0 refills | Status: DC | PRN

## 2022-02-07 MED ORDER — PROMETHAZINE-DM 6.25-15 MG/5ML PO SYRP: 2.5000 mL | ORAL_SOLUTION | Freq: Two times a day (BID) | ORAL | 0 refills | Status: DC | PRN

## 2022-02-07 NOTE — Progress Notes (Signed)
?Virtual Visit Consent  ? ?Nicole Petty, you are scheduled for a virtual visit with a [facility] provider today.   ?  ?Just as with appointments in the office, your consent must be obtained to participate.  Your consent will be active for this visit and any virtual visit you may have with one of our providers in the next 365 days.   ?  ?If you have a MyChart account, a copy of this consent can be sent to you electronically.  All virtual visits are billed to your insurance company just like a traditional visit in the office.   ? ?As this is a virtual visit, video technology does not allow for your provider to perform a traditional examination.  This may limit your provider's ability to fully assess your condition.  If your provider identifies any concerns that need to be evaluated in person or the need to arrange testing (such as labs, EKG, etc.), we will make arrangements to do so.   ?  ?Although advances in technology are sophisticated, we cannot ensure that it will always work on either your end or our end.  If the connection with a video visit is poor, the visit may have to be switched to a telephone visit.  With either a video or telephone visit, we are not always able to ensure that we have a secure connection.    ? ?I need to obtain your verbal consent now.   Are you willing to proceed with your visit today?  ?  ?MARABELLA POPIEL has provided verbal consent on 02/07/2022 for a virtual visit (video or telephone). ?  ?Freddy Finner, NP  ? ?Date: 02/07/2022 8:20 AM ? ? ?Virtual Visit via Video Note  ? ?IFreddy Finner, connected with  CURTIS URIARTE  ([id number], 1951-09-18) on 02/07/22 at  8:30 AM EDT by a video-enabled telemedicine application and verified that I am speaking with the correct person using two identifiers. ? ?Location: ?Patient: Virtual Visit Location Patient: Home ?Provider: Virtual Visit Location Provider: Home Office ?  ?I discussed the limitations of evaluation and management by telemedicine  and the availability of in person appointments. The patient expressed understanding and agreed to proceed.   ? ?History of Present Illness: ?Nicole Petty is a 71 y.o. who identifies as a female who was assigned female at birth, and is being seen today for on going cough related to covid.  ?Was seen on 01/30/22 for covid. She completed the Molnupiravir as directed. But reports on going cough with mucus and temp 99.1-4. She says mucus is creamy- no bloody, rust or brown color. Denies chest pain, shortness of breath, wheezing, or pain with breathing. All other symptoms she had are gone. Cough is lingering and would like help with that.  ? ? ? ? ?Problems:  ?Patient Active Problem List  ? Diagnosis Date Noted  ? Osteoarthritis of both hands 11/27/2018  ? Osteoporosis 09/29/2016  ?  ?Allergies: No Known Allergies ?Medications:  ?Current Outpatient Medications:  ?  b complex vitamins capsule, Take by mouth., Disp: , Rfl:  ?  CALCIUM PO, Take 1 tablet by mouth daily., Disp: , Rfl:  ?  Calcium-Vitamin D 500-125 MG-UNIT TABS, Take by mouth., Disp: , Rfl:  ?  Cholecalciferol (VITAMIN D PO), Take 1 tablet by mouth daily. , Disp: , Rfl:  ?  COLLAGEN PO, Take by mouth., Disp: , Rfl:  ?  cycloSPORINE (RESTASIS) 0.05 % ophthalmic emulsion, Place 1 drop into  both eyes 2 (two) times daily. , Disp: , Rfl:  ?  glucosamine-chondroitin 500-400 MG tablet, Take by mouth., Disp: , Rfl:  ?  ibandronate (BONIVA) 150 MG tablet, TAKE 1 TABLET BY MOUTH  EVERY  MONTH, Disp: 3 tablet, Rfl: 3 ?  ibandronate (BONIVA) 150 MG tablet, Take in the morning with a full glass of water, on an empty stomach, and do not take anything else by mouth or lie down for the next 30 min., Disp: 3 tablet, Rfl: 0 ?  Omega-3 Fatty Acids (FISH OIL PO), Take 1 tablet by mouth daily. , Disp: , Rfl:  ? ?Observations/Objective: ?Patient is well-developed, well-nourished in no acute distress.  ?Resting comfortably  at home.  ?Head is normocephalic, atraumatic.  ?No labored  breathing.  ?Speech is clear and coherent with logical content.  ?Patient is alert and oriented at baseline.  ?Mild cough noted during visit. ? ?Assessment and Plan: ?1. COVID-19 ? ?- benzonatate (TESSALON) 100 MG capsule; Take 1 capsule (100 mg total) by mouth 3 (three) times daily as needed for cough.  Dispense: 30 capsule; Refill: 0 ?- promethazine-dextromethorphan (PROMETHAZINE-DM) 6.25-15 MG/5ML syrup; Take 2.5 mLs by mouth 2 (two) times daily as needed for cough.  Dispense: 118 mL; Refill: 0 ? ?2. Acute cough ? ?- benzonatate (TESSALON) 100 MG capsule; Take 1 capsule (100 mg total) by mouth 3 (three) times daily as needed for cough.  Dispense: 30 capsule; Refill: 0 ?- promethazine-dextromethorphan (PROMETHAZINE-DM) 6.25-15 MG/5ML syrup; Take 2.5 mLs by mouth 2 (two) times daily as needed for cough.  Dispense: 118 mL; Refill: 0 ?- guaiFENesin (MUCINEX) 600 MG 12 hr tablet; Take 1 tablet (600 mg total) by mouth 2 (two) times daily for 7 days.  Dispense: 14 tablet; Refill: 0 ? ?She is doing well- slowly improving post antiviral, lingering cough educated on post viral coughs duration. Will provide cough medication as per above to help. ? ? ? Reviewed side effects, risks and benefits of medication.   ? ?Patient acknowledged agreement and understanding of the plan.  ? ? ? ?Follow Up Instructions: ?I discussed the assessment and treatment plan with the patient. The patient was provided an opportunity to ask questions and all were answered. The patient agreed with the plan and demonstrated an understanding of the instructions.  A copy of instructions were sent to the patient via MyChart unless otherwise noted below.  ? ? ?The patient was advised to call back or seek an in-person evaluation if the symptoms worsen or if the condition fails to improve as anticipated. ? ?Time:  ?I spent 12 minutes with the patient via telehealth technology discussing the above problems/concerns.   ? ?Freddy Finner, NP ? ?

## 2022-02-07 NOTE — Patient Instructions (Addendum)
Cough, Adult ?A cough helps to clear your throat and lungs. A cough may be a sign of an illness or another medical condition. ?An acute cough may only last 2-3 weeks, while a chronic cough may last 8 or more weeks. ?Many things can cause a cough. They include: ?Germs (viruses or bacteria) that attack the airway. ?Breathing in things that bother (irritate) your lungs. ?Allergies. ?Asthma. ?Mucus that runs down the back of your throat (postnasal drip). ?Smoking. ?Acid backing up from the stomach into the tube that moves food from the mouth to the stomach (gastroesophageal reflux). ?Some medicines. ?Lung problems. ?Other medical conditions, such as heart failure or a blood clot in the lung (pulmonary embolism). ?Follow these instructions at home: ?Medicines ?Take over-the-counter and prescription medicines only as told by your doctor. ?Talk with your doctor before you take medicines that stop a cough (cough suppressants). ?Lifestyle ? ?Do not smoke, and try not to be around smoke. Do not use any products that contain nicotine or tobacco, such as cigarettes, e-cigarettes, and chewing tobacco. If you need help quitting, ask your doctor. ?Drink enough fluid to keep your pee (urine) pale yellow. ?Avoid caffeine. ?Do not drink alcohol if your doctor tells you not to drink. ?General instructions ? ?Watch for any changes in your cough. Tell your doctor about them. ?Always cover your mouth when you cough. ?Stay away from things that make you cough, such as perfume, candles, campfire smoke, or cleaning products. ?If the air is dry, use a cool mist vaporizer or humidifier in your home. ?If your cough is worse at night, try using extra pillows to raise your head up higher while you sleep. ?Rest as needed. ?Keep all follow-up visits as told by your doctor. This is important. ?Contact a doctor if: ?You have new symptoms. ?You cough up pus. ?Your cough does not get better after 2-3 weeks, or your cough gets worse. ?Cough medicine  does not help your cough and you are not sleeping well. ?You have pain that gets worse or pain that is not helped with medicine. ?You have a fever. ?You are losing weight and you do not know why. ?You have night sweats. ?Get help right away if: ?You cough up blood. ?You have trouble breathing. ?Your heartbeat is very fast. ?These symptoms may be an emergency. Do not wait to see if the symptoms will go away. Get medical help right away. Call your local emergency services (911 in the [place] Do not drive yourself to the hospital. ?Summary ?A cough helps to clear your throat and lungs. Many things can cause a cough. ?Take over-the-counter and prescription medicines only as told by your doctor. ?Always cover your mouth when you cough. ?Contact a doctor if you have new symptoms or you have a cough that does not get better or gets worse. ?This information is not intended to replace advice given to you by your health care provider. Make sure you discuss any questions you have with your health care provider. ?Document Revised: 12/06/2019 Document Reviewed: 11/05/2018 ?Elsevier Patient Education ? 2022 Elsevier Inc. ? ?Promethazine; Dextromethorphan Oral Solution ?What is this medication? ?PROMETHAZINE; DEXTROMETHORPHAN (proe METH a zeen; dex troe meth OR fan) is a cough suppressant and an antihistamine. It is used to treat coughing due to colds or allergies. This medicine will not treat an infection. ?This medicine may be used for other purposes; ask your health care provider or pharmacist if you have questions. ?COMMON BRAND NAME(S): Phen Tuss DM ?What should I tell  my care team before I take this medication? ?They need to know if you have any of the following conditions: ?blockage in your bowel ?diabetes ?eczema ?glaucoma ?heart disease ?liver disease ?low blood counts, like low white cell, platelet, or red cell counts ?lung or breathing disease, like asthma ?Parkinson's disease ?prostate disease ?seizures ?stomach or  intestine problems ?trouble passing urine ?an unusual or allergic reaction to promethazine, dextromethorphan, other medicines, foods, dyes, or preservatives ?pregnant or trying to get pregnant ?breast-feeding ?How should I use this medication? ?Take this medicine by mouth with a glass of water. Follow the directions on the prescription label. Use a specially marked spoon or container to measure your medicine. Household spoons are not accurate. Take your doses at regular intervals. Do not take your medicine more often than directed. ?Talk to your pediatrician regarding the use of this medicine in children. Special care may be needed. Do not use this medicine in children less than 69 years of age. ?Patients over 69 years old may have a stronger reaction and need a smaller dose. ?Overdosage: If you think you have taken too much of this medicine contact a poison control center or emergency room at once. ?NOTE: This medicine is only for you. Do not share this medicine with others. ?What if I miss a dose? ?If you miss a dose, take it as soon as you can. If it is almost time for your next dose, take only that dose. Do not take double or extra doses. ?What may interact with this medication? ?Do not take this medicine with any of the following medications: ?MAOIs like Carbex, Eldepryl, Marplan, Nardil, and Parnate ?This medicine may also interact with the following medications: ?alcohol or alcohol-containing products ?antihistamines for allergy, cough, and cold ?atropine ?certain medicines for anxiety or sleep ?certain medicines for bladder problems like oxybutynin, tolterodine ?certain medicines for depression like amitriptyline, fluoxetine, sertraline ?certain medicines for Parkinson's disease like benztropine, trihexyphenidyl ?certain medicines for stomach problems like dicyclomine, hyoscyamine ?certain medicines for travel sickness like scopolamine ?epinephrine ?general anesthetics like halothane, isoflurane,  methoxyflurane, propofol ?ipratropium ?medicines for depression, anxiety or psychotic disturbances ?medicines for high blood pressure ?medicines for seizures like phenobarbital, primidone, phenytoin ?medicines that relax muscles for surgery ?metoclopramide ?narcotic medicines for pain ?This list may not describe all possible interactions. Give your health care provider a list of all the medicines, herbs, non-prescription drugs, or dietary supplements you use. Also tell them if you smoke, drink alcohol, or use illegal drugs. Some items may interact with your medicine. ?What should I watch for while using this medication? ?Tell your doctor if your symptoms do not improve or if they get worse. ?You may get drowsy or dizzy. Do not drive, use machinery, or do anything that needs mental alertness until you know how this medicine affects you. Do not stand or sit up quickly, especially if you are an older patient. This reduces the risk of dizzy or fainting spells. Alcohol may interfere with the effect of this medicine. Avoid alcoholic drinks. ?This medicine can make you more sensitive to the sun. Keep out of the sun. If you cannot avoid being in the sun, wear protective clothing and use sunscreen. Do not use sun lamps or tanning beds/booths. ?What side effects may I notice from receiving this medication? ?Side effects that you should report to your doctor or health care professional as soon as possible: ?allergic reactions like skin rash, itching or hives, swelling of the face, lips, or tongue ?changes in vision ?confusion ?fever,  chills, sore throat ?restlessness ?seizures ?signs and symptoms of high blood sugar such as being more thirsty or hungry or having to urinate more than normal. You may also feel very tired or have blurry vision. ?signs and symptoms of liver injury like dark yellow or brown urine; general ill feeling or flu-like symptoms; light-colored stools; loss of appetite; nausea; right upper belly pain;  unusually weak or tired; yellowing of the eyes or skin ?signs and symptoms of low blood pressure like dizziness; feeling faint or lightheaded, falls; unusually weak or tired ?trouble passing urine or change in the amount of

## 2022-07-18 ENCOUNTER — Encounter: Payer: Medicare Other | Admitting: Family Medicine

## 2022-07-18 ENCOUNTER — Ambulatory Visit (INDEPENDENT_AMBULATORY_CARE_PROVIDER_SITE_OTHER): Payer: Medicare Other

## 2022-07-18 DIAGNOSIS — Z23 Encounter for immunization: Secondary | ICD-10-CM | POA: Diagnosis not present

## 2022-08-10 ENCOUNTER — Ambulatory Visit (INDEPENDENT_AMBULATORY_CARE_PROVIDER_SITE_OTHER): Payer: Medicare Other | Admitting: Family Medicine

## 2022-08-10 ENCOUNTER — Encounter: Payer: Self-pay | Admitting: Family Medicine

## 2022-08-10 VITALS — BP 110/80 | HR 69 | Temp 98.0°F | Ht 61.0 in | Wt 130.2 lb

## 2022-08-10 DIAGNOSIS — M810 Age-related osteoporosis without current pathological fracture: Secondary | ICD-10-CM

## 2022-08-10 DIAGNOSIS — Z1212 Encounter for screening for malignant neoplasm of rectum: Secondary | ICD-10-CM

## 2022-08-10 DIAGNOSIS — Z1211 Encounter for screening for malignant neoplasm of colon: Secondary | ICD-10-CM

## 2022-08-10 DIAGNOSIS — M19042 Primary osteoarthritis, left hand: Secondary | ICD-10-CM

## 2022-08-10 DIAGNOSIS — Z0000 Encounter for general adult medical examination without abnormal findings: Secondary | ICD-10-CM

## 2022-08-10 DIAGNOSIS — M25562 Pain in left knee: Secondary | ICD-10-CM

## 2022-08-10 DIAGNOSIS — Z780 Asymptomatic menopausal state: Secondary | ICD-10-CM | POA: Diagnosis not present

## 2022-08-10 DIAGNOSIS — G8929 Other chronic pain: Secondary | ICD-10-CM

## 2022-08-10 DIAGNOSIS — M7061 Trochanteric bursitis, right hip: Secondary | ICD-10-CM | POA: Diagnosis not present

## 2022-08-10 DIAGNOSIS — M25561 Pain in right knee: Secondary | ICD-10-CM

## 2022-08-10 DIAGNOSIS — M19041 Primary osteoarthritis, right hand: Secondary | ICD-10-CM

## 2022-08-10 DIAGNOSIS — M7062 Trochanteric bursitis, left hip: Secondary | ICD-10-CM

## 2022-08-10 LAB — COMPREHENSIVE METABOLIC PANEL
ALT: 20 U/L (ref 0–35)
AST: 25 U/L (ref 0–37)
Albumin: 4.4 g/dL (ref 3.5–5.2)
Alkaline Phosphatase: 59 U/L (ref 39–117)
BUN: 14 mg/dL (ref 6–23)
CO2: 28 mEq/L (ref 19–32)
Calcium: 9.4 mg/dL (ref 8.4–10.5)
Chloride: 104 mEq/L (ref 96–112)
Creatinine, Ser: 0.85 mg/dL (ref 0.40–1.20)
GFR: 69.74 mL/min (ref >60.00)
Glucose, Bld: 95 mg/dL (ref 70–99)
Potassium: 4 mEq/L (ref 3.5–5.1)
Sodium: 141 mEq/L (ref 135–145)
Total Bilirubin: 0.5 mg/dL (ref 0.2–1.2)
Total Protein: 7.4 g/dL (ref 6.0–8.3)

## 2022-08-10 LAB — CBC WITH DIFFERENTIAL/PLATELET
Basophils Absolute: 0 10*3/uL (ref 0.0–0.1)
Basophils Relative: 0.3 % (ref 0.0–3.0)
Eosinophils Absolute: 0.1 10*3/uL (ref 0.0–0.7)
Eosinophils Relative: 1.5 % (ref 0.0–5.0)
HCT: 39.1 % (ref 36.0–46.0)
Hemoglobin: 13.1 g/dL (ref 12.0–15.0)
Lymphocytes Relative: 18.2 % (ref 12.0–46.0)
Lymphs Abs: 0.8 10*3/uL (ref 0.7–4.0)
MCHC: 33.5 g/dL (ref 30.0–36.0)
MCV: 93.2 fl (ref 78.0–100.0)
Monocytes Absolute: 0.3 10*3/uL (ref 0.1–1.0)
Monocytes Relative: 5.8 % (ref 3.0–12.0)
Neutro Abs: 3.3 10*3/uL (ref 1.4–7.7)
Neutrophils Relative %: 74.2 % (ref 43.0–77.0)
Platelets: 248 10*3/uL (ref 150.0–400.0)
RBC: 4.2 Mil/uL (ref 3.87–5.11)
RDW: 14.3 % (ref 11.5–15.5)
WBC: 4.5 10*3/uL (ref 4.0–10.5)

## 2022-08-10 LAB — LIPID PANEL
Cholesterol: 210 mg/dL — ABNORMAL HIGH (ref 0–200)
HDL: 87.9 mg/dL (ref >39.00)
LDL Cholesterol: 112 mg/dL — ABNORMAL HIGH (ref 0–99)
NonHDL: 121.77
Total CHOL/HDL Ratio: 2
Triglycerides: 47 mg/dL (ref 0.0–149.0)
VLDL: 9.4 mg/dL (ref 0.0–40.0)

## 2022-08-10 LAB — TSH: TSH: 1.16 u[IU]/mL (ref 0.35–5.50)

## 2022-08-10 LAB — VITAMIN D 25 HYDROXY (VIT D DEFICIENCY, FRACTURES): VITD: 47.2 ng/mL (ref 30.00–100.00)

## 2022-08-10 NOTE — Patient Instructions (Signed)
Please return in 12 months for your annual complete physical; please come fasting. Sooner if need more help with your hip or knee pain.  I will release your lab results to you on your MyChart account with further instructions. You may see the results before I do, but when I review them I will send you a message with my report or have my assistant call you if things need to be discussed. Please reply to my message with any questions. Thank you!   I recommend the Cologuard test for your colon cancer screening that is due. I have ordered this test for you. [facility] will soon contact you to verify your insurance, address etc. They will then send you the kit; follow the instructions in the kit and return the kit to [facility]. They will run the test and send the results to me. I will then give you the results. If this test is negative, we recommend repeating a colon cancer screening test in 3 years. If it is positive, I will refer you to a Gastroenterologist so you can get set up for the recommended colonoscopy.  Thank you!   If you have any questions or concerns, please don't hesitate to send me a message via MyChart or call the office at [phone]. Thank you for visiting with [place] today! It's our pleasure caring for you.   I have ordered a mammogram and/or bone density for you as we discussed today: they are due in November _0   Mammogram  _1   Bone Density  Please call the office checked below to schedule your appointment:  _2   [facility]      [place]         _3   [facility]  [address] [place], [place]

## 2022-08-10 NOTE — Progress Notes (Signed)
Subjective  Chief Complaint  Patient presents with   Annual Exam    Pt here for Annual Exam and is currently fasting     HPI: Nicole Petty is a 71 y.o. female who presents to [facility] [place] today for a Female Wellness Visit. She also has the concerns and/or needs as listed above in the chief complaint. These will be addressed in addition to the Health Maintenance Visit.   Wellness Visit: annual visit with health maintenance review and exam without Pap  HM: screens: eligible for crc screen. Had neg cologuard 3 years ago. Avg risk. No sxs. Imms up to date Chronic disease f/u and/or acute problem visit: (deemed necessary to be done in addition to the wellness visit): Osteoporosis on boniva; due for recheck and due drug holiday if stable.  C/o bilateral hip pain; started about 6 months ago but after recent trip to [place] where she walked a lot, she has worsening pain causing limping. Improved after 400mg  ibuprofen and rest.  Has bilateral knee pain intermittently. No redness or swelling. No injury Used DSMO and trigger finger sxs resolved.   Assessment  1. Annual physical exam   2. Age-related osteoporosis without current pathological fracture   3. Screening for colorectal cancer   4. Asymptomatic menopausal state   5. Greater trochanteric bursitis of both hips   6. Primary osteoarthritis of both hands   7. Chronic pain of both knees      Plan  Female Wellness Visit: Age appropriate Health Maintenance and Prevention measures were discussed with patient. Included topics are cancer screening recommendations, ways to keep healthy (see AVS) including dietary and exercise recommendations, regular eye and dental care, use of seat belts, and avoidance of moderate alcohol use and tobacco use. Cologuard ordered.  BMI: discussed patient's BMI and encouraged positive lifestyle modifications to help get to or maintain a target BMI. HM needs and immunizations were  addressed and ordered. See below for orders. See HM and immunization section for updates.utd Routine labs and screening tests ordered including cmp, cbc and lipids where appropriate. Discussed recommendations regarding Vit D and calcium supplementation (see AVS)  Chronic disease management visit and/or acute problem visit: Hip bursitis bilaterally: RICE, ibuprofen, stretches and HO given. Will f/u if not improving.  Knee pain: likely OA +/- PFS: vmo exercises demonstrated. ICE and nsaids. F/u if not improving for xrays Recheck dexa:   Follow up: 12 mo for cpe. Sooner if needed  Orders Placed This Encounter  Procedures   DG Bone Density   Cologuard   CBC with Differential/Platelet   Comprehensive metabolic panel   Lipid panel   TSH   VITAMIN D 25 Hydroxy (Vit-D Deficiency, Fractures)   No orders of the defined types were placed in this encounter.     Body mass index is 24.6 kg/m. Wt Readings from Last 3 Encounters:  08/10/22 130 lb 3.2 oz (59.1 kg)  09/09/21 128 lb (58.1 kg)  07/12/21 128 lb 3.2 oz (58.2 kg)     Patient Active Problem List   Diagnosis Date Noted   Osteoarthritis of both hands 11/27/2018    1st mcps    Osteoporosis 09/29/2016    DEXA: 08/2020; lowest T = -2.6 spine. Osteoporosis persists on boniva since 2017. rec continuation through 2022 and recheck DEXA in 2023. Drug holiday startin 10/2021.  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  Osteopenia at multiple sites (improved on boniva).. Follow up in 2 years. R femur neck T-Score: -2.10 L femur T-Score: -1.90 R Total femur T-Score: -1.60 L Total femur T-Score: -1.90 AP Total Spine T-Score: -2.40    Health Maintenance  Topic Date Due   Fecal DNA (Cologuard)  08/04/2022   COVID-19 Vaccine (4 - Pfizer series) 08/26/2022 (Originally 10/29/2020)   DEXA SCAN  09/14/2022   MAMMOGRAM  10/07/2022    TETANUS/TDAP  05/28/2028   Pneumonia Vaccine 67+ Years old  Completed   INFLUENZA VACCINE  Completed   Hepatitis C Screening  Completed   Zoster Vaccines- Shingrix  Completed   HPV VACCINES  Aged Out   Immunization History  Administered Date(s) Administered   Fluad Quad(high Dose 65+) 07/23/2019, 07/31/2020, 07/12/2021, 07/18/2022   Influenza-Unspecified 11/06/2018   PFIZER(Purple Top)SARS-COV-2 Vaccination 11/28/2019, 12/20/2019, 09/03/2020   Pneumococcal Conjugate-13 11/28/2018   Pneumococcal Polysaccharide-23 04/27/2020   Tdap 08/29/2007   Zoster Recombinat (Shingrix) 03/06/2018, 06/18/2018   Zoster, Live 04/08/2013   We updated and reviewed the patient's past history in detail and it is documented below. Allergies: Patient has No Known Allergies. Past Medical History Patient  has a past medical history of Osteoarthritis, Osteopenia, Osteoporosis, Vitamin D deficiency, and Xerosis cutis. Past Surgical History Patient  has no past surgical history on file. Family History: Patient family history includes Arthritis in her mother; Hearing loss in her father and mother; Hypertension in her father and mother; Osteoarthritis in an other family member. Social History:  Patient  reports that she has never smoked. She has never used smokeless tobacco. She reports that she does not currently use alcohol. She reports that she does not use drugs.  Review of Systems: Constitutional: negative for fever or malaise Ophthalmic: negative for photophobia, double vision or loss of vision Cardiovascular: negative for chest pain, dyspnea on exertion, or new LE swelling Respiratory: negative for SOB or persistent cough Gastrointestinal: negative for abdominal pain, change in bowel habits or melena Genitourinary: negative for dysuria or gross hematuria, no abnormal uterine bleeding or disharge Musculoskeletal: negative for new gait disturbance or muscular weakness Integumentary: negative for new or  persistent rashes, no breast lumps Neurological: negative for TIA or stroke symptoms Psychiatric: negative for SI or delusions Allergic/Immunologic: negative for hives  Patient Care Team    Relationship Specialty Notifications Start End  Leamon Arnt, MD PCP - General Family Medicine  11/27/18   Juanita Craver, MD Consulting Physician Gastroenterology  11/27/18   Marygrace Drought, MD Consulting Physician Ophthalmology  11/27/18   Royston Cowper, [place]  Dentistry  11/27/18   Rod Can, MD Consulting Physician Orthopedic Surgery  07/23/19     Objective  Vitals: BP 110/80   Pulse 69   Temp 98 F (36.7 C)   Ht 5\' 1"  (1.549 m)   Wt 130 lb 3.2 oz (59.1 kg)   SpO2 98%   BMI 24.60 kg/m  General:  Well developed, well nourished, no acute distress  Psych:  Alert and orientedx3,normal mood and affect HEENT:  Normocephalic, atraumatic, non-icteric sclera,  supple neck without adenopathy, mass or thyromegaly Cardiovascular:  Normal S1, S2, RRR without gallop, rub or murmur Respiratory:  Good breath sounds bilaterally, CTAB with normal respiratory effort Gastrointestinal: normal bowel sounds, soft, non-tender, no noted masses. No HSM MSK: no deformities, contusions. Joints are without erythema or swelling.  Skin:  Warm, no rashes or suspicious lesions noted Neurologic:    Mental status is normal. CN 2-11 are normal. Gross motor and  sensory exams are normal. No tremor Bilater gr troch bursa tender; FROM bilateral hip but pain with internal rotation.  Bilat knees with mild crepitus FROM no joint line ttp.  Limp/antalgic gait  Commons side effects, risks, benefits, and alternatives for medications and treatment plan prescribed today were discussed, and the patient expressed understanding of the given instructions. Patient is instructed to call or message via MyChart if he/she has any questions or concerns regarding our treatment plan. No barriers to understanding were identified. We discussed Red  Flag symptoms and signs in detail. Patient expressed understanding regarding what to do in case of urgent or emergency type symptoms.  Medication list was reconciled, printed and provided to the patient in AVS. Patient instructions and summary information was reviewed with the patient as documented in the AVS. This note was prepared with assistance of Dragon voice recognition software. Occasional wrong-word or sound-a-like substitutions may have occurred due to the inherent limitations of voice recognition software

## 2022-08-11 ENCOUNTER — Telehealth: Payer: Self-pay | Admitting: Family Medicine

## 2022-08-11 ENCOUNTER — Encounter: Payer: Self-pay | Admitting: Family Medicine

## 2022-08-11 NOTE — Telephone Encounter (Signed)
Patient states: -She had CPE on 10/11 and noticed she was referred to [facility] for her mammogram and bone density   Patient requests: -Referral for mammogram and bone density be sent to [facility] instead

## 2022-08-16 ENCOUNTER — Other Ambulatory Visit: Payer: Self-pay

## 2022-08-16 ENCOUNTER — Telehealth: Payer: Self-pay | Admitting: Family Medicine

## 2022-08-16 DIAGNOSIS — Z111 Encounter for screening for respiratory tuberculosis: Secondary | ICD-10-CM

## 2022-08-16 NOTE — Telephone Encounter (Signed)
Patient states; -She needs to get a TB quantiferon gold test for work   Patient requests: -PCP order lab so she may have it completed at PCP office   Please advise.

## 2022-08-17 ENCOUNTER — Other Ambulatory Visit: Payer: Medicare Other

## 2022-08-17 DIAGNOSIS — Z111 Encounter for screening for respiratory tuberculosis: Secondary | ICD-10-CM

## 2022-08-23 ENCOUNTER — Telehealth: Payer: Self-pay | Admitting: Family Medicine

## 2022-08-23 LAB — QUANTIFERON-TB GOLD PLUS
Mitogen-NIL: 3.92 IU/mL
NIL: 0.02 IU/mL
QuantiFERON-TB Gold Plus: NEGATIVE
TB1-NIL: 0 IU/mL
TB2-NIL: 0 IU/mL

## 2022-08-23 NOTE — Telephone Encounter (Signed)
Patient request to be called at ph# [phone] to be given lab results for TB Test

## 2022-08-24 LAB — COLOGUARD

## 2022-08-24 NOTE — Telephone Encounter (Signed)
Lab results has been given to pt.

## 2022-09-06 ENCOUNTER — Ambulatory Visit: Payer: Medicare Other

## 2022-09-15 LAB — COLOGUARD: COLOGUARD: NEGATIVE

## 2022-10-10 LAB — HM MAMMOGRAPHY: HM Mammogram: NORMAL (ref 0–4)

## 2022-10-11 LAB — HM DEXA SCAN

## 2022-10-18 ENCOUNTER — Encounter: Payer: Self-pay | Admitting: Family Medicine

## 2022-11-26 IMAGING — DX DG ANKLE COMPLETE 3+V*L*
3 series · 3 of 3 positions shown · non-contrast
Comparison: X-ray 12/04/2014

CLINICAL DATA: Lateral left foot and ankle pain after twisting
injury 1 day ago

EXAM:
LEFT FOOT - 2 VIEW; LEFT ANKLE COMPLETE - 3+ VIEW

[ankle ap]
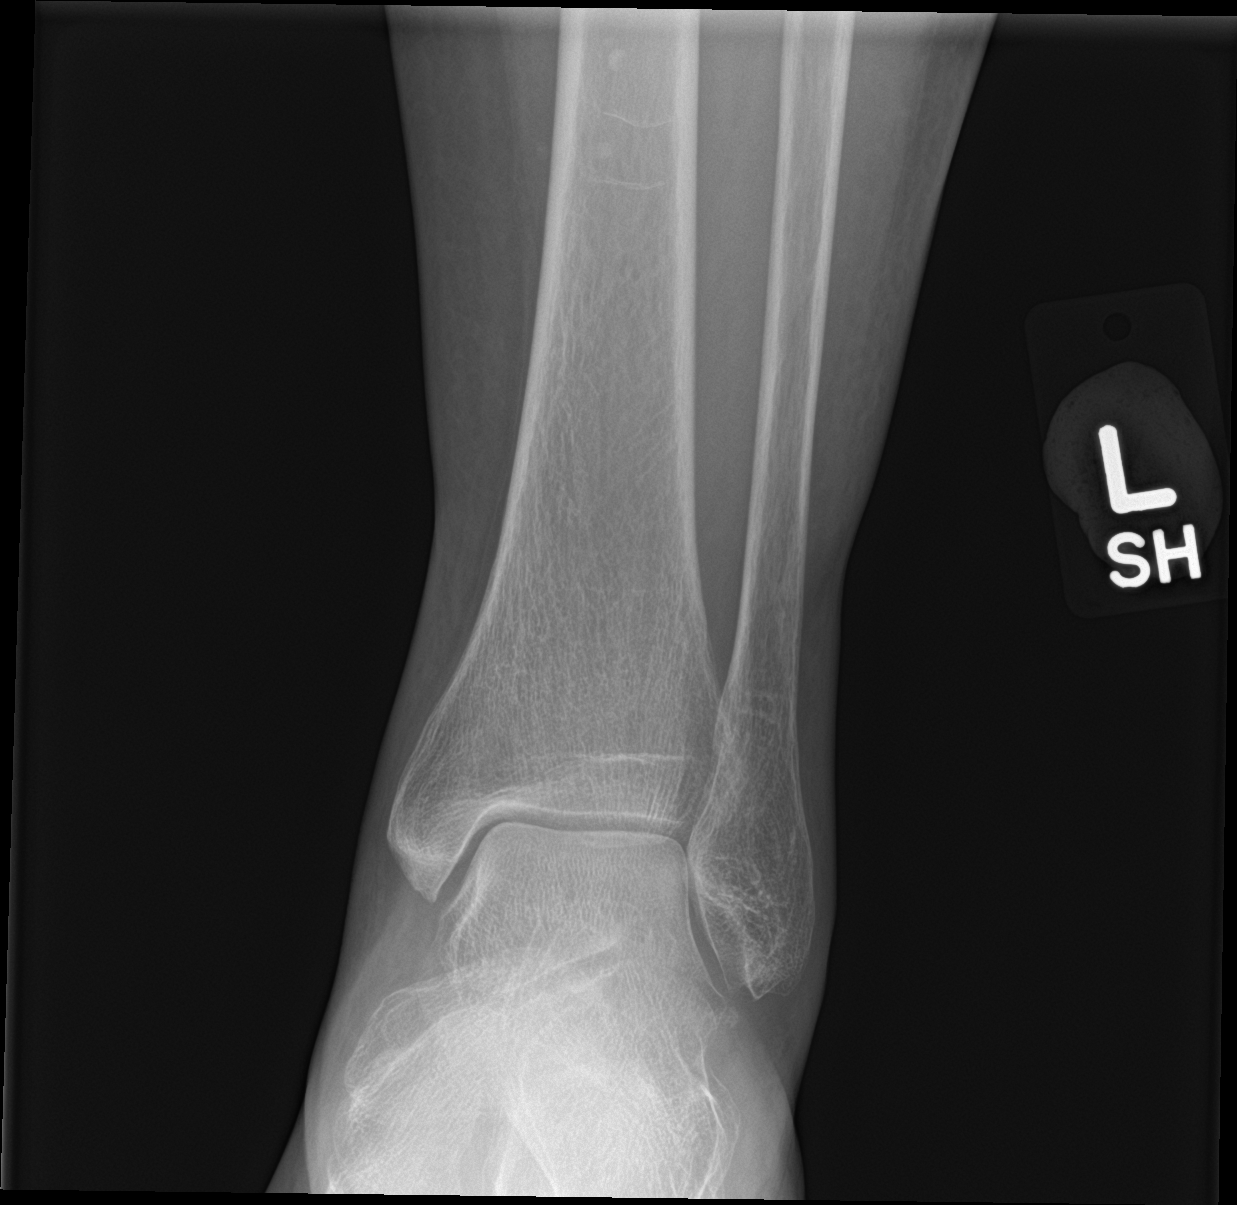

[ankle obl]
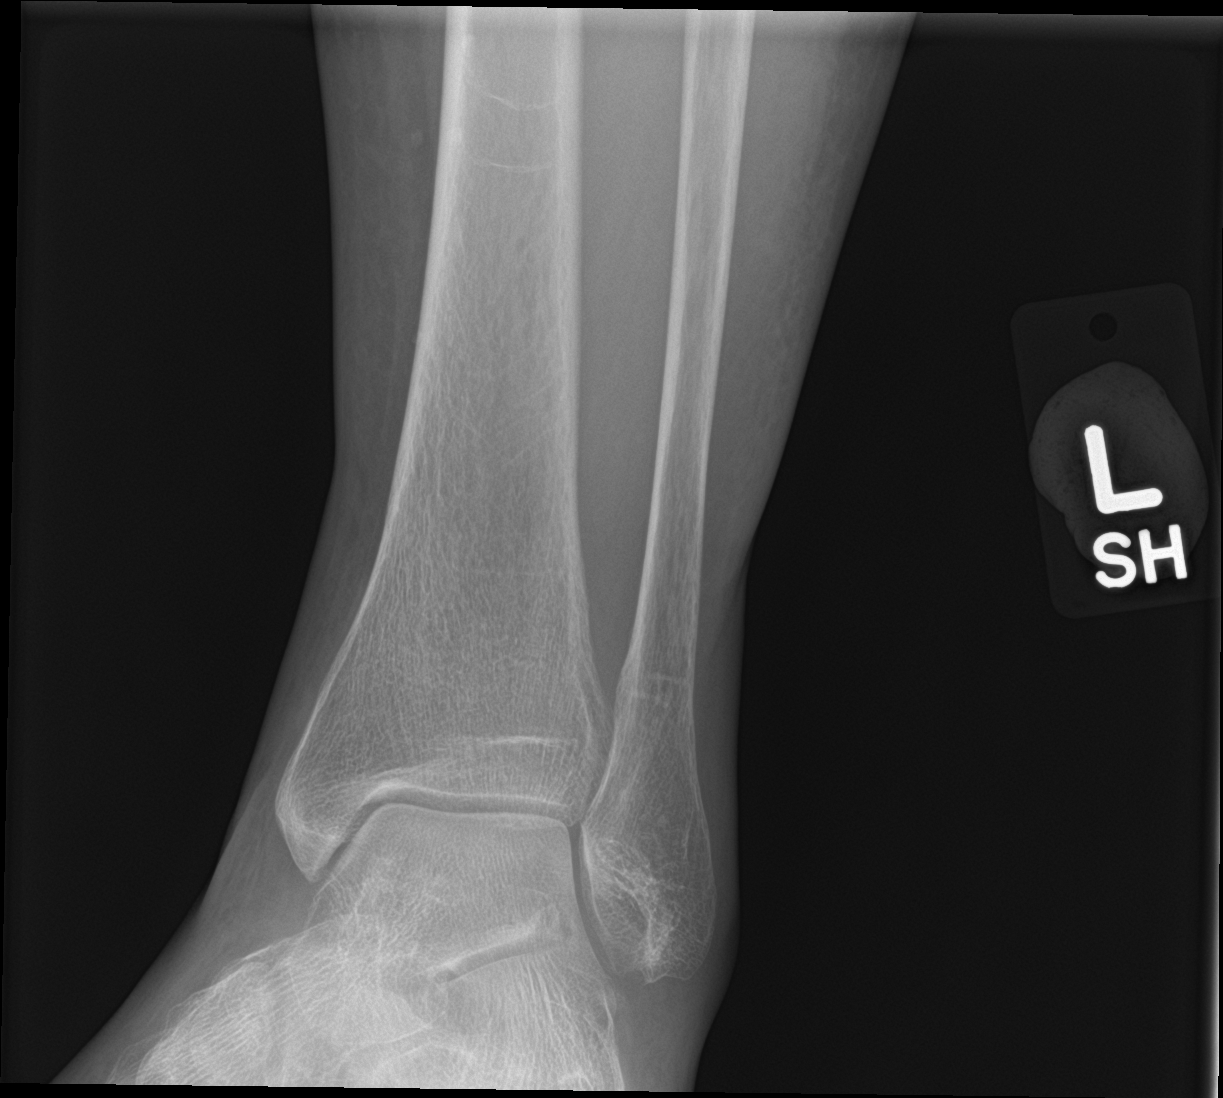

[ankle lat]
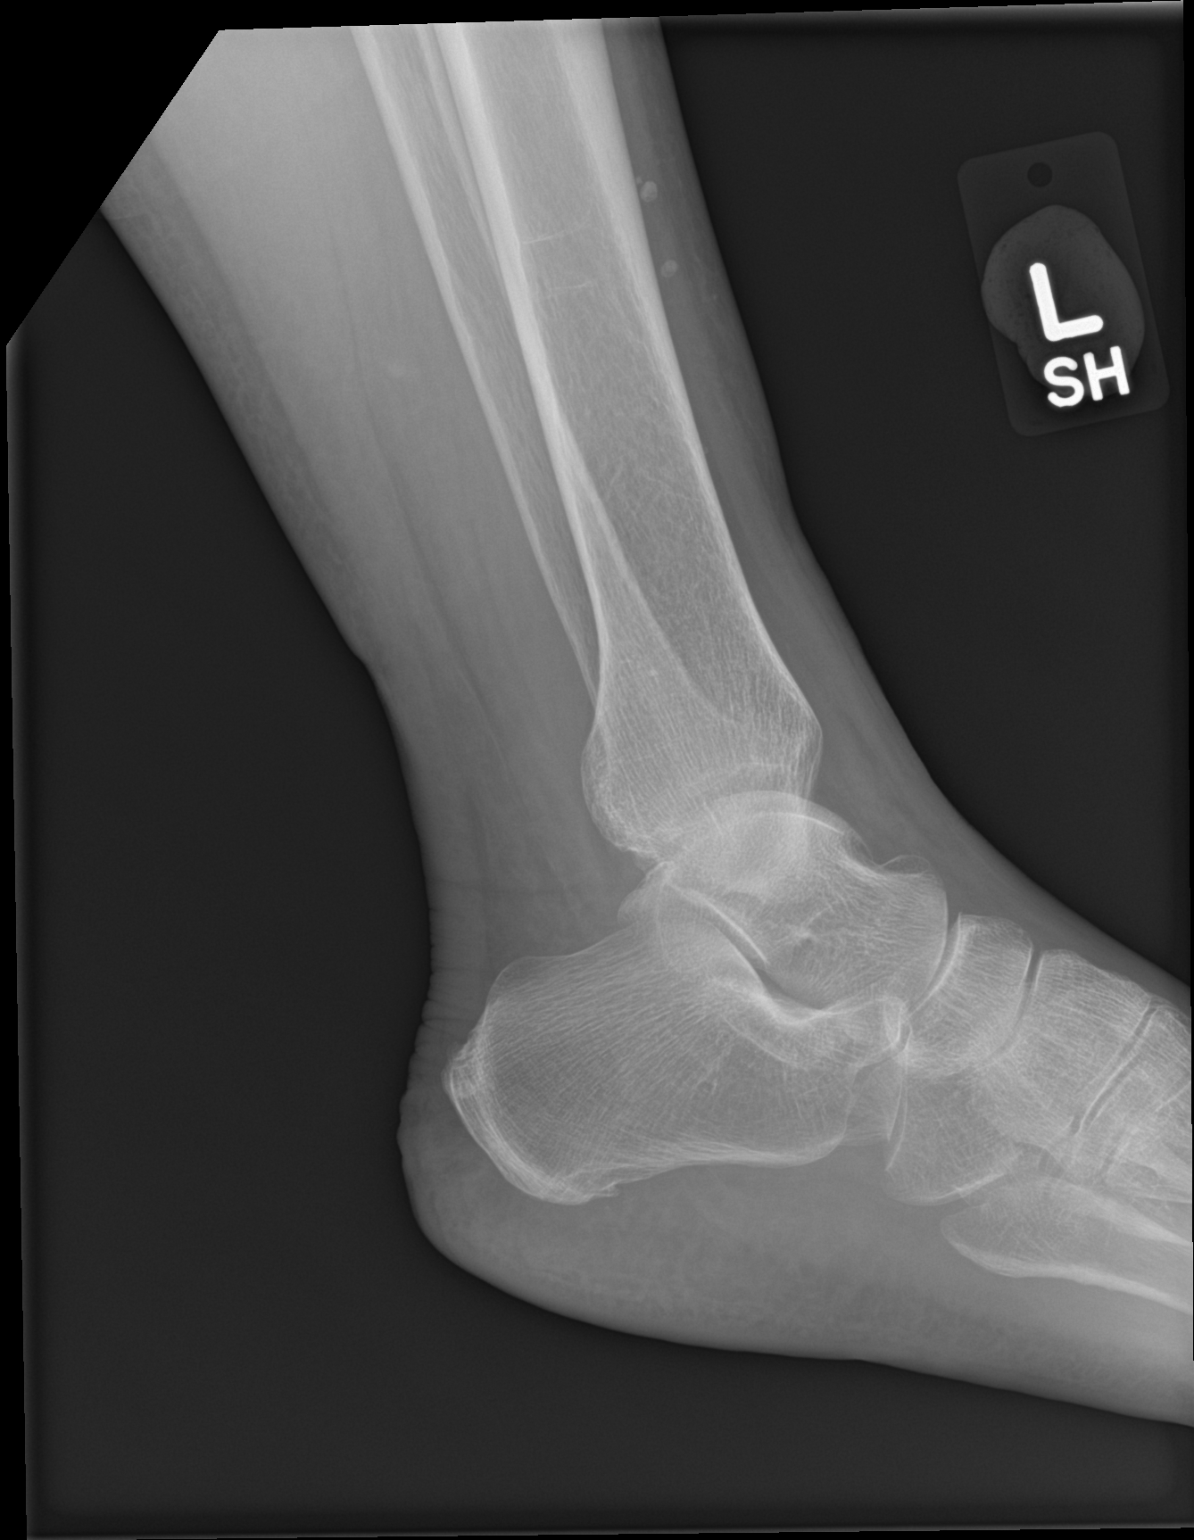

[3 of 3 positions shown; findings below may reference images not displayed]

FINDINGS: There is no evidence of fracture or dislocation of the left foot or
ankle. There is no evidence of arthropathy or other focal bone
abnormality. No focal soft tissue swelling.
IMPRESSION: Negative.

## 2022-11-26 IMAGING — DX DG FOOT 2V*L*
2 series · 2 of 2 positions shown · non-contrast
Comparison: X-ray 12/04/2014

CLINICAL DATA: Lateral left foot and ankle pain after twisting
injury 1 day ago

EXAM:
LEFT FOOT - 2 VIEW; LEFT ANKLE COMPLETE - 3+ VIEW

[foot ap]
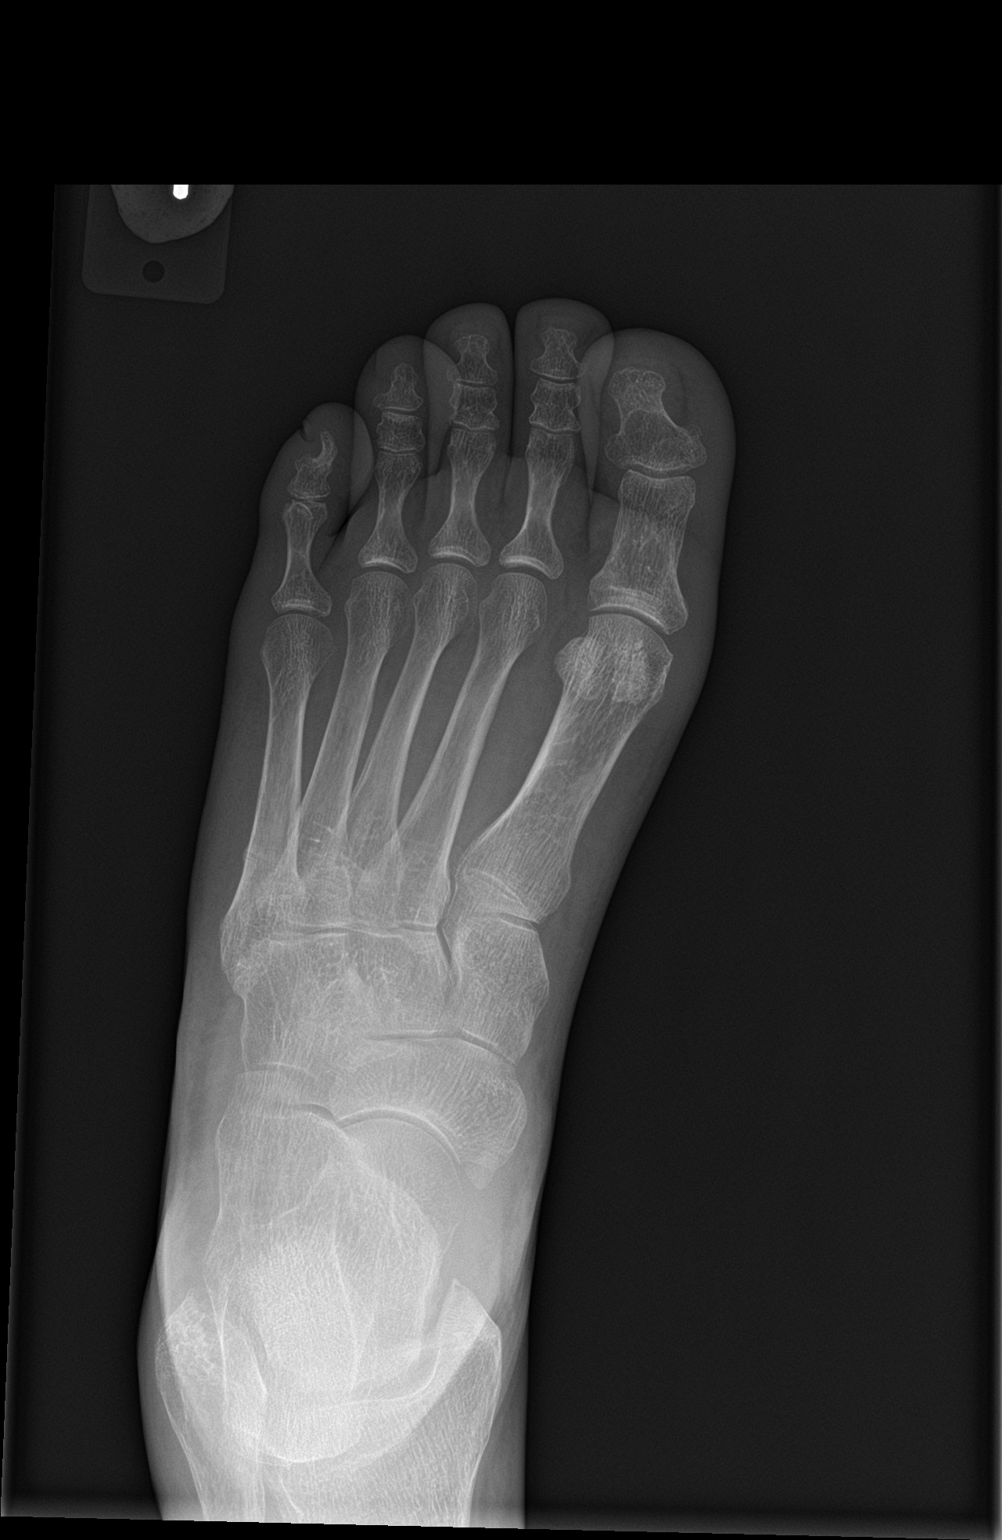

[foot lat]
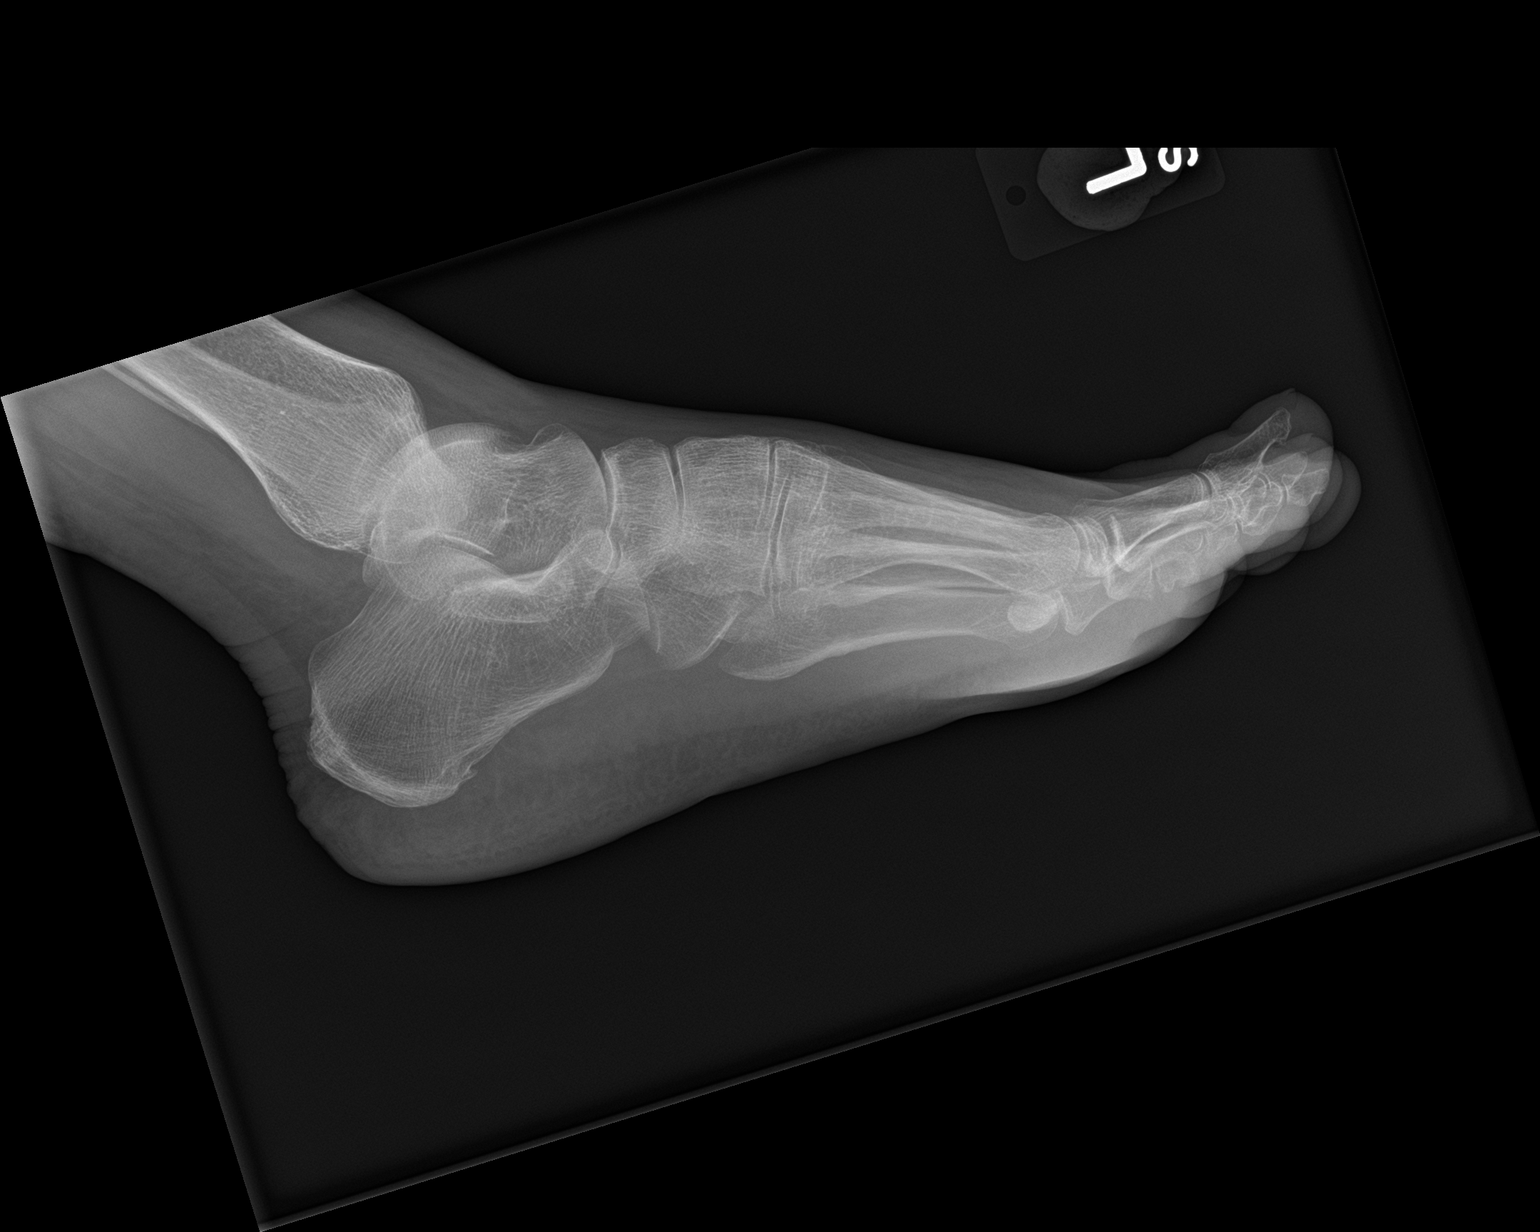

[2 of 2 positions shown; findings below may reference images not displayed]

FINDINGS: There is no evidence of fracture or dislocation of the left foot or
ankle. There is no evidence of arthropathy or other focal bone
abnormality. No focal soft tissue swelling.
IMPRESSION: Negative.

## 2022-12-14 ENCOUNTER — Other Ambulatory Visit: Payer: Self-pay | Admitting: Family Medicine

## 2023-04-18 ENCOUNTER — Ambulatory Visit (INDEPENDENT_AMBULATORY_CARE_PROVIDER_SITE_OTHER): Payer: Medicare Other

## 2023-04-18 VITALS — BP 110/72 | HR 73 | Temp 98.4°F | Wt 132.6 lb

## 2023-04-18 DIAGNOSIS — Z0000 Encounter for general adult medical examination without abnormal findings: Secondary | ICD-10-CM

## 2023-04-18 NOTE — Progress Notes (Addendum)
Review of Systems     Cardiac Risk Factors include: advanced age (>49men, >54 women)     Objective:    Today's Vitals   04/18/23 0811  BP: 110/72  Pulse: 73  Temp: 98.4 F (36.9 C)  SpO2: 98%  Weight: 132 lb 9.6 oz (60.1 kg)   Body mass index is 25.05 kg/m.     04/18/2023    8:15 AM  Advanced Directives  Does Patient Have a Medical Advance Directive? No  Would patient like information on creating a medical advance directive? Yes (MAU/Ambulatory/Procedural Areas - Information given)    Current Medications (verified) Outpatient Encounter Medications as of 04/18/2023  Medication Sig   b complex vitamins capsule Take by mouth.   CALCIUM PO Take 1 tablet by mouth daily.   Calcium-Vitamin D 500-125 MG-UNIT TABS Take by mouth.   Cholecalciferol (VITAMIN D PO) Take 1 tablet by mouth daily.    COLLAGEN PO Take by mouth.   cycloSPORINE (RESTASIS) 0.05 % ophthalmic emulsion Place 1 drop into both eyes 2 (two) times daily.    glucosamine-chondroitin 500-400 MG tablet Take by mouth.   ibandronate (BONIVA) 150 MG tablet TAKE 1 TABLET BY MOUTH EVERY  MONTH   Omega-3 Fatty Acids (FISH OIL PO) Take 1 tablet by mouth daily.    No facility-administered encounter medications on file as of 04/18/2023.    Allergies (verified) Patient has no known allergies.   History: Past Medical History:  Diagnosis Date   Osteoarthritis    Osteopenia    Osteoporosis    Vitamin D deficiency    Xerosis cutis    History reviewed. No pertinent surgical history. Family History  Problem Relation Age of Onset   Osteoarthritis Other    Hypertension Mother    Arthritis Mother    Hearing loss Mother    Hypertension Father    Hearing loss Father    Social History   Socioeconomic History   Marital status: Married    Spouse name: Not on file   Number of children: Not on file   Years of education: Not on file   Highest education level: Not on file  Occupational History   Occupation:  interpreter    Employer: [employer]  Tobacco Use   Smoking status: Never   Smokeless tobacco: Never  Vaping Use   Vaping Use: Never used  Substance and Sexual Activity   Alcohol use: Not Currently   Drug use: Never   Sexual activity: Not Currently    Birth control/protection: Post-menopausal  Other Topics Concern   Not on file  Social History Narrative   Not on file   Social Determinants of Health   Financial Resource Strain: Low Risk  (04/18/2023)   Overall Financial Resource Strain (CARDIA)    Difficulty of Paying Living Expenses: Not hard at all  Food Insecurity: No Food Insecurity (04/18/2023)   Hunger Vital Sign    Worried About Running Out of Food in the Last Year: Never true    Ran Out of Food in the Last Year: Never true  Transportation Needs: No Transportation Needs (04/18/2023)   PRAPARE - Administrator, Civil Service (Medical): No    Lack of Transportation (Non-Medical): No  Physical Activity: Inactive (04/18/2023)   Exercise Vital Sign    Days of Exercise per Week: 0 days    Minutes of Exercise per Session: 0 min  Stress: No Stress Concern Present (04/18/2023)   [employer] of Occupational Health -  Occupational Stress Questionnaire    Feeling of Stress : Not at all  Social Connections: Moderately Integrated (04/18/2023)   Social Connection and Isolation Panel [NHANES]    Frequency of Communication with Friends and Family: More than three times a week    Frequency of Social Gatherings with Friends and Family: More than three times a week    Attends Religious Services: More than 4 times per year    Active Member of [employer] or Organizations: No    Attends Engineer, structural: Never    Marital Status: Married    Tobacco Counseling Counseling given: Not Answered   Clinical Intake:  Pre-visit preparation completed: Yes  Pain : No/denies pain     BMI - recorded: 25.05 Nutritional Status: BMI 25 -29 Overweight Nutritional  Risks: None Diabetes: No  How often do you need to have someone help you when you read instructions, pamphlets, or other written materials from your doctor or pharmacy?: 1 - Never  Interpreter Needed?: No  Information entered by :: Lanier Ensign, LPN   Activities of Daily Living    04/18/2023    8:16 AM  In your present state of health, do you have any difficulty performing the following activities:  Hearing? 1  Comment left ear hearing aid  Vision? 0  Difficulty concentrating or making decisions? 0  Walking or climbing stairs? 0  Dressing or bathing? 0  Doing errands, shopping? 0  Preparing Food and eating ? N  Using the Toilet? N  In the past six months, have you accidently leaked urine? Y  Comment feel a little leak when heavy lifting  Do you have problems with loss of bowel control? N  Managing your Medications? N  Managing your Finances? N  Housekeeping or managing your Housekeeping? N    Patient Care Team: Willow Ora, MD as PCP - General (Family Medicine) Charna Elizabeth, MD as Consulting Physician (Gastroenterology) Janet Berlin, MD as Consulting Physician (Ophthalmology) Juluis Pitch, DDS (Dentistry) Samson Frederic, MD as Consulting Physician (Orthopedic Surgery)  Indicate any recent Medical Services you may have received from other than [facility] providers in the past year (date may be approximate).     Assessment:   This is a routine wellness examination for [facility].  Hearing/Vision screen Hearing Screening - Comments:: Pt wears hearing aid left ear  Vision Screening - Comments:: Pt follows up with Dr Janet Berlin for annual eye exams   Dietary issues and exercise activities discussed:     Goals Addressed             This Visit's Progress    Patient Stated       Get rid of joint pain        Depression Screen    04/18/2023    8:14 AM 08/10/2022    1:05 PM 07/12/2021   10:00 AM 04/27/2020   10:41 AM 07/23/2019   11:15 AM 11/27/2018   11:06  AM  PHQ 2/9 Scores  PHQ - 2 Score 0 0 0 0 0 0    Fall Risk    04/18/2023    8:16 AM 08/10/2022    1:05 PM 07/12/2021   10:00 AM 07/23/2019   11:15 AM 11/27/2018   11:02 AM  Fall Risk   Falls in the past year? 0 0 0 1 1  Number falls in past yr: 0 0 0 0 0  Injury with Fall? 0 0 0 0 1  Risk for fall due to : Impaired vision;Impaired  mobility No Fall Risks  History of fall(s) History of fall(s)  Risk for fall due to: Comment hip joints      Follow up Falls prevention discussed Falls evaluation completed  Falls evaluation completed Falls evaluation completed    MEDICARE RISK AT HOME:  Medicare Risk at Home - 04/18/23 0818     Any stairs in or around the home? No    If so, are there any without handrails? No    Home free of loose throw rugs in walkways, pet beds, electrical cords, etc? Yes    Adequate lighting in your home to reduce risk of falls? Yes    Life alert? No    Use of a cane, walker or w/c? No    Grab bars in the bathroom? No    Shower chair or bench in shower? Yes    Elevated toilet seat or a handicapped toilet? No             TIMED UP AND GO:  Was the test performed? Yes  Length of time to ambulate 10 feet: 10 sec Gait steady and fast without use of assistive device    Cognitive Function:        04/18/2023    8:17 AM  6CIT Screen  What Year? 0 points  What month? 0 points  What time? 0 points  Count back from 20 0 points  Months in reverse 0 points  Repeat phrase 0 points  Total Score 0 points    Immunizations Immunization History  Administered Date(s) Administered   Fluad Quad(high Dose 65+) 07/23/2019, 07/31/2020, 07/12/2021, 07/18/2022   Influenza Split 09/17/2015, 06/25/2016   Influenza, Quadrivalent, Recombinant, Inj, Pf 10/03/2017   Influenza,inj,Quad PF,6+ Mos 11/06/2018   Influenza-Unspecified 11/06/2018   PFIZER(Purple Top)SARS-COV-2 Vaccination 11/28/2019, 12/20/2019, 09/03/2020   Pneumococcal Conjugate-13 11/28/2018   Pneumococcal  Polysaccharide-23 04/27/2020   Td 05/28/2018   Tdap 08/29/2007   Zoster Recombinat (Shingrix) 03/06/2018, 06/18/2018   Zoster, Live 04/08/2013   Zoster, Unspecified 06/18/2018    TDAP status: Up to date  Flu Vaccine status: Up to date  Pneumococcal vaccine status: Up to date  Covid-19 vaccine status: Completed vaccines  Qualifies for Shingles Vaccine? Yes   Zostavax completed Yes   Shingrix Completed?: Yes  Screening Tests Health Maintenance  Topic Date Due   COVID-19 Vaccine (4 - 2023-24 season) 07/01/2022   INFLUENZA VACCINE  06/01/2023   MAMMOGRAM  10/11/2023   Medicare Annual Wellness (AWV)  04/17/2024   DEXA SCAN  10/11/2024   Fecal DNA (Cologuard)  09/06/2025   DTaP/Tdap/Td (3 - Td or Tdap) 05/28/2028   Pneumonia Vaccine 22+ Years old  Completed   Hepatitis C Screening  Completed   Zoster Vaccines- Shingrix  Completed   HPV VACCINES  Aged Out    Health Maintenance  Health Maintenance Due  Topic Date Due   COVID-19 Vaccine (4 - 2023-24 season) 07/01/2022    Colorectal cancer screening: Type of screening: Cologuard. Completed 09/06/22. Repeat every 3 years  Mammogram status: Completed 10/10/22. Repeat every year  Bone Density status: Completed 10/11/22. Results reflect: Bone density results: OSTEOPOROSIS. Repeat every 2 years.   Additional Screening:  Hepatitis C Screening: Completed 01/01/19  Vision Screening: Recommended annual ophthalmology exams for early detection of glaucoma and other disorders of the eye. Is the patient up to date with their annual eye exam?  Yes  Who is the provider or what is the name of the office in which the patient attends annual eye  exams? Dr Burgess Estelle If pt is not established with a provider, would they like to be referred to a provider to establish care? No .   Dental Screening: Recommended annual dental exams for proper oral hygiene  Community Resource Referral / Chronic Care Management: CRR required this visit?  No   CCM  required this visit?  No     Plan:     I have personally reviewed and noted the following in the patient's chart:   Medical and social history Use of alcohol, tobacco or illicit drugs  Current medications and supplements including opioid prescriptions. Patient is not currently taking opioid prescriptions. Functional ability and status Nutritional status Physical activity Advanced directives List of other physicians Hospitalizations, surgeries, and ER visits in previous 12 months Vitals Screenings to include cognitive, depression, and falls Referrals and appointments  In addition, I have reviewed and discussed with patient certain preventive protocols, quality metrics, and best practice recommendations. A written personalized care plan for preventive services as well as general preventive health recommendations were provided to patient.     Marzella Schlein, LPN   1/61/0960   After Visit Summary: (MyChart) Due to this being a telephonic visit, the after visit summary with patients personalized plan was offered to patient via MyChart   Nurse Notes: none

## 2023-04-18 NOTE — Patient Instructions (Signed)
Nicole Petty , Thank you for taking time to come for your Medicare Wellness Visit. I appreciate your ongoing commitment to your health goals. Please review the following plan we discussed and let me know if I can assist you in the future.   These are the goals we discussed:  Goals   None     This is a list of the screening recommended for you and due dates:  Health Maintenance  Topic Date Due   DTaP/Tdap/Td vaccine (2 - Td or Tdap) 08/28/2017   COVID-19 Vaccine (4 - 2023-24 season) 07/01/2022   Flu Shot  06/01/2023   Mammogram  10/11/2023   Medicare Annual Wellness Visit  04/17/2024   DEXA scan (bone density measurement)  10/11/2024   Cologuard (Stool DNA test)  09/06/2025   Pneumonia Vaccine  Completed   Hepatitis C Screening  Completed   Zoster (Shingles) Vaccine  Completed   HPV Vaccine  Aged Out    Advanced directives: Advance directive discussed with you today. I have provided a copy for you to complete at home and have notarized. Once this is complete please bring a copy in to our office so we can scan it into your chart.  Conditions/risks identified: get better joint mobility   Next appointment: Follow up in one year for your annual wellness visit    Preventive Care 65 Years and Older, Female Preventive care refers to lifestyle choices and visits with your health care provider that can promote health and wellness. What does preventive care include? A yearly physical exam. This is also called an annual well check. Dental exams once or twice a year. Routine eye exams. Ask your health care provider how often you should have your eyes checked. Personal lifestyle choices, including: Daily care of your teeth and gums. Regular physical activity. Eating a healthy diet. Avoiding tobacco and drug use. Limiting alcohol use. Practicing safe sex. Taking low-dose aspirin every day. Taking vitamin and mineral supplements as recommended by your health care provider. What happens  during an annual well check? The services and screenings done by your health care provider during your annual well check will depend on your age, overall health, lifestyle risk factors, and family history of disease. Counseling  Your health care provider may ask you questions about your: Alcohol use. Tobacco use. Drug use. Emotional well-being. Home and relationship well-being. Sexual activity. Eating habits. History of falls. Memory and ability to understand (cognition). Work and work Astronomer. Reproductive health. Screening  You may have the following tests or measurements: Height, weight, and BMI. Blood pressure. Lipid and cholesterol levels. These may be checked every 5 years, or more frequently if you are over 74 years old. Skin check. Lung cancer screening. You may have this screening every year starting at age 83 if you have a 30-pack-year history of smoking and currently smoke or have quit within the past 15 years. Fecal occult blood test (FOBT) of the stool. You may have this test every year starting at age 59. Flexible sigmoidoscopy or colonoscopy. You may have a sigmoidoscopy every 5 years or a colonoscopy every 10 years starting at age 47. Hepatitis C blood test. Hepatitis B blood test. Sexually transmitted disease (STD) testing. Diabetes screening. This is done by checking your blood sugar (glucose) after you have not eaten for a while (fasting). You may have this done every 1-3 years. Bone density scan. This is done to screen for osteoporosis. You may have this done starting at age 79. Mammogram. This may  be done every 1-2 years. Talk to your health care provider about how often you should have regular mammograms. Talk with your health care provider about your test results, treatment options, and if necessary, the need for more tests. Vaccines  Your health care provider may recommend certain vaccines, such as: Influenza vaccine. This is recommended every  year. Tetanus, diphtheria, and acellular pertussis (Tdap, Td) vaccine. You may need a Td booster every 10 years. Zoster vaccine. You may need this after age 25. Pneumococcal 13-valent conjugate (PCV13) vaccine. One dose is recommended after age 6. Pneumococcal polysaccharide (PPSV23) vaccine. One dose is recommended after age 52. Talk to your health care provider about which screenings and vaccines you need and how often you need them. This information is not intended to replace advice given to you by your health care provider. Make sure you discuss any questions you have with your health care provider. Document Released: 11/13/2015 Document Revised: 07/06/2016 Document Reviewed: 08/18/2015 Elsevier Interactive Patient Education  2017 [employer] Prevention in the Home Falls can cause injuries. They can happen to people of all ages. There are many things you can do to make your home safe and to help prevent falls. What can I do on the outside of my home? Regularly fix the edges of walkways and driveways and fix any cracks. Remove anything that might make you trip as you walk through a door, such as a raised step or threshold. Trim any bushes or trees on the path to your home. Use bright outdoor lighting. Clear any walking paths of anything that might make someone trip, such as rocks or tools. Regularly check to see if handrails are loose or broken. Make sure that both sides of any steps have handrails. Any raised decks and porches should have guardrails on the edges. Have any leaves, snow, or ice cleared regularly. Use sand or salt on walking paths during winter. Clean up any spills in your garage right away. This includes oil or grease spills. What can I do in the bathroom? Use night lights. Install grab bars by the toilet and in the tub and shower. Do not use towel bars as grab bars. Use non-skid mats or decals in the tub or shower. If you need to sit down in the shower, use a  plastic, non-slip stool. Keep the floor dry. Clean up any water that spills on the floor as soon as it happens. Remove soap buildup in the tub or shower regularly. Attach bath mats securely with double-sided non-slip rug tape. Do not have throw rugs and other things on the floor that can make you trip. What can I do in the bedroom? Use night lights. Make sure that you have a light by your bed that is easy to reach. Do not use any sheets or blankets that are too big for your bed. They should not hang down onto the floor. Have a firm chair that has side arms. You can use this for support while you get dressed. Do not have throw rugs and other things on the floor that can make you trip. What can I do in the kitchen? Clean up any spills right away. Avoid walking on wet floors. Keep items that you use a lot in easy-to-reach places. If you need to reach something above you, use a strong step stool that has a grab bar. Keep electrical cords out of the way. Do not use floor polish or wax that makes floors slippery. If you must use  wax, use non-skid floor wax. Do not have throw rugs and other things on the floor that can make you trip. What can I do with my stairs? Do not leave any items on the stairs. Make sure that there are handrails on both sides of the stairs and use them. Fix handrails that are broken or loose. Make sure that handrails are as long as the stairways. Check any carpeting to make sure that it is firmly attached to the stairs. Fix any carpet that is loose or worn. Avoid having throw rugs at the top or bottom of the stairs. If you do have throw rugs, attach them to the floor with carpet tape. Make sure that you have a light switch at the top of the stairs and the bottom of the stairs. If you do not have them, ask someone to add them for you. What else can I do to help prevent falls? Wear shoes that: Do not have high heels. Have rubber bottoms. Are comfortable and fit you  well. Are closed at the toe. Do not wear sandals. If you use a stepladder: Make sure that it is fully opened. Do not climb a closed stepladder. Make sure that both sides of the stepladder are locked into place. Ask someone to hold it for you, if possible. Clearly mark and make sure that you can see: Any grab bars or handrails. First and last steps. Where the edge of each step is. Use tools that help you move around (mobility aids) if they are needed. These include: Canes. Walkers. Scooters. Crutches. Turn on the lights when you go into a dark area. Replace any light bulbs as soon as they burn out. Set up your furniture so you have a clear path. Avoid moving your furniture around. If any of your floors are uneven, fix them. If there are any pets around you, be aware of where they are. Review your medicines with your doctor. Some medicines can make you feel dizzy. This can increase your chance of falling. Ask your doctor what other things that you can do to help prevent falls. This information is not intended to replace advice given to you by your health care provider. Make sure you discuss any questions you have with your health care provider. Document Released: 08/13/2009 Document Revised: 03/24/2016 Document Reviewed: 11/21/2014 Elsevier Interactive Patient Education  2017 [employer].

## 2023-05-04 ENCOUNTER — Encounter: Payer: Self-pay | Admitting: Family Medicine

## 2023-08-14 ENCOUNTER — Encounter: Payer: Self-pay | Admitting: Family Medicine

## 2023-08-14 ENCOUNTER — Ambulatory Visit (INDEPENDENT_AMBULATORY_CARE_PROVIDER_SITE_OTHER): Payer: Medicare Other | Admitting: Family Medicine

## 2023-08-14 VITALS — BP 138/76 | HR 68 | Temp 98.0°F | Ht 61.0 in | Wt 132.8 lb

## 2023-08-14 DIAGNOSIS — Z13220 Encounter for screening for lipoid disorders: Secondary | ICD-10-CM | POA: Diagnosis not present

## 2023-08-14 DIAGNOSIS — H00012 Hordeolum externum right lower eyelid: Secondary | ICD-10-CM

## 2023-08-14 DIAGNOSIS — Z0000 Encounter for general adult medical examination without abnormal findings: Secondary | ICD-10-CM

## 2023-08-14 DIAGNOSIS — M810 Age-related osteoporosis without current pathological fracture: Secondary | ICD-10-CM | POA: Diagnosis not present

## 2023-08-14 DIAGNOSIS — H01002 Unspecified blepharitis right lower eyelid: Secondary | ICD-10-CM | POA: Diagnosis not present

## 2023-08-14 DIAGNOSIS — M7062 Trochanteric bursitis, left hip: Secondary | ICD-10-CM | POA: Diagnosis not present

## 2023-08-14 DIAGNOSIS — Z0001 Encounter for general adult medical examination with abnormal findings: Secondary | ICD-10-CM | POA: Diagnosis not present

## 2023-08-14 LAB — COMPREHENSIVE METABOLIC PANEL
ALT: 21 U/L (ref 0–35)
AST: 23 U/L (ref 0–37)
Albumin: 4 g/dL (ref 3.5–5.2)
Alkaline Phosphatase: 84 U/L (ref 39–117)
BUN: 31 mg/dL — ABNORMAL HIGH (ref 6–23)
CO2: 28 meq/L (ref 19–32)
Calcium: 9.3 mg/dL (ref 8.4–10.5)
Chloride: 107 meq/L (ref 96–112)
Creatinine, Ser: 0.69 mg/dL (ref 0.40–1.20)
GFR: 87.72 mL/min (ref >60.00)
Glucose, Bld: 90 mg/dL (ref 70–99)
Potassium: 4.5 meq/L (ref 3.5–5.1)
Sodium: 140 meq/L (ref 135–145)
Total Bilirubin: 0.3 mg/dL (ref 0.2–1.2)
Total Protein: 6.4 g/dL (ref 6.0–8.3)

## 2023-08-14 LAB — CBC WITH DIFFERENTIAL/PLATELET
Basophils Absolute: 0 10*3/uL (ref 0.0–0.1)
Basophils Relative: 0.3 % (ref 0.0–3.0)
Eosinophils Absolute: 0.1 10*3/uL (ref 0.0–0.7)
Eosinophils Relative: 2.1 % (ref 0.0–5.0)
HCT: 40.9 % (ref 36.0–46.0)
Hemoglobin: 13.2 g/dL (ref 12.0–15.0)
Lymphocytes Relative: 16.3 % (ref 12.0–46.0)
Lymphs Abs: 0.7 10*3/uL (ref 0.7–4.0)
MCHC: 32.2 g/dL (ref 30.0–36.0)
MCV: 95.1 fL (ref 78.0–100.0)
Monocytes Absolute: 0.3 10*3/uL (ref 0.1–1.0)
Monocytes Relative: 6 % (ref 3.0–12.0)
Neutro Abs: 3.5 10*3/uL (ref 1.4–7.7)
Neutrophils Relative %: 75.3 % (ref 43.0–77.0)
Platelets: 244 10*3/uL (ref 150.0–400.0)
RBC: 4.31 Mil/uL (ref 3.87–5.11)
RDW: 14.1 % (ref 11.5–15.5)
WBC: 4.6 10*3/uL (ref 4.0–10.5)

## 2023-08-14 LAB — LIPID PANEL
Cholesterol: 192 mg/dL (ref 0–200)
HDL: 70.9 mg/dL (ref >39.00)
LDL Cholesterol: 112 mg/dL — ABNORMAL HIGH (ref 0–99)
NonHDL: 121.06
Total CHOL/HDL Ratio: 3
Triglycerides: 44 mg/dL (ref 0.0–149.0)
VLDL: 8.8 mg/dL (ref 0.0–40.0)

## 2023-08-14 MED ORDER — ERYTHROMYCIN 5 MG/GM OP OINT: 1.0000 | TOPICAL_OINTMENT | Freq: Three times a day (TID) | OPHTHALMIC | 0 refills | Status: DC

## 2023-08-14 MED ORDER — TRIAMCINOLONE ACETONIDE 40 MG/ML IJ SUSP
40.0000 mg | Freq: Once | INTRAMUSCULAR | Status: AC
Start: 2023-08-14 — End: 2023-08-14
  Administered 2023-08-14: 40 mg via INTRAMUSCULAR

## 2023-08-14 NOTE — Progress Notes (Signed)
Subjective  Chief Complaint  Patient presents with   Annual Exam    Pt here for Annual Exam and is currently fasting    HPI: Nicole Petty is a 72 y.o. female who presents to [facility] [place] today for a Female Wellness Visit. She also has the concerns and/or needs as listed above in the chief complaint. These will be addressed in addition to the Health Maintenance Visit.   Wellness Visit: annual visit with health maintenance review and exam  HM: doing well. Cologuard neg last year. Mammo current and due in 09/2024. Dexa reviewed 09/2022: osteoporosis and started drug holiday from biphosphonates (taking since 2017). Imms current  Chronic disease f/u and/or acute problem visit: (deemed necessary to be done in addition to the wellness visit): Osteoporosis as above.  C/o left hip pain, on and off for some time. Pain on left side while lying on it or while walking a long ways. No back pain or radicular sxs. No injuries.  Skin tags: neck Stye x 2; has been to eye doctor; ongoing x 4 weeks. Somewhat improved.    Assessment  1. Annual physical exam   2. Age-related osteoporosis without current pathological fracture   3. Hordeolum externum of right lower eyelid   4. Blepharitis of right lower eyelid, unspecified type   5. Greater trochanteric bursitis, left      Plan  Female Wellness Visit: Age appropriate Health Maintenance and Prevention measures were discussed with patient. Included topics are cancer screening recommendations, ways to keep healthy (see AVS) including dietary and exercise recommendations, regular eye and dental care, use of seat belts, and avoidance of moderate alcohol use and tobacco use. Mammo in December.  BMI: discussed patient's BMI and encouraged positive lifestyle modifications to help get to or maintain a target BMI. HM needs and immunizations were addressed and ordered. See below for orders. See HM and immunization section for  updates. Routine labs and screening tests ordered including cmp, cbc and lipids where appropriate. Discussed recommendations regarding Vit D and calcium supplementation (see AVS)  Chronic disease management visit and/or acute problem visit: Osteoporosis on drug holiday; recheck dexa 09/2024. Continue walking, d and calcium Blepharitis and styes: change to emycin ointment.  Hip bursitis s/p steroid injection given today. Post procedure care instructions given. Ice. Stretching. See AVS.   Follow up: 12 mo for cpe   Orders Placed This Encounter  Procedures   Lipid panel   Comprehensive metabolic panel   CBC with Differential/Platelet   Meds ordered this encounter  Medications   erythromycin ophthalmic ointment    Sig: Place 1 Application into the right eye 3 (three) times daily. And apply to lower eyelashes    Dispense:  3.5 g    Refill:  0      Body mass index is 25.09 kg/m. Wt Readings from Last 3 Encounters:  08/14/23 132 lb 12.8 oz (60.2 kg)  04/18/23 132 lb 9.6 oz (60.1 kg)  08/10/22 130 lb 3.2 oz (59.1 kg)     Patient Active Problem List   Diagnosis Date Noted Date Diagnosed   Osteoarthritis of both hands 11/27/2018     1st mcps    Osteoporosis 09/29/2016     Dexa 10/11/2022, [facility]:  t = -2.8 at lumbar spine; new technique so can't compare to 2021 data.  Rec drug holiday and recheck in 2 years.drug holiday actually started 10/2022 DEXA: 08/2020; lowest T = -2.6 spine. Osteoporosis persists on boniva since 2017. rec  continuation through 2022 and recheck DEXA in 2023. Drug holiday starting 10/2022.  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  Osteopenia at multiple sites (improved on boniva).. Follow up in 2 years. R femur neck T-Score: -2.10 L femur T-Score: -1.90 R Total femur T-Score: -1.60 L Total femur T-Score: -1.90 AP Total Spine T-Score: -2.40    Health  Maintenance  Topic Date Due   COVID-19 Vaccine (4 - 2023-24 season) 08/30/2023 (Originally 07/02/2023)   MAMMOGRAM  10/11/2023   Medicare Annual Wellness (AWV)  04/17/2024   DEXA SCAN  10/11/2024   Fecal DNA (Cologuard)  09/06/2025   DTaP/Tdap/Td (3 - Td or Tdap) 05/28/2028   Pneumonia Vaccine 64+ Years old  Completed   INFLUENZA VACCINE  Completed   Hepatitis C Screening  Completed   Zoster Vaccines- Shingrix  Completed   HPV VACCINES  Aged Out   Immunization History  Administered Date(s) Administered   Fluad Quad(high Dose 65+) 07/23/2019, 07/31/2020, 07/12/2021, 07/18/2022   Fluad Trivalent(High Dose 65+) 07/21/2023   Influenza Split 09/17/2015, 06/25/2016   Influenza, Quadrivalent, Recombinant, Inj, Pf 10/03/2017   Influenza,inj,Quad PF,6+ Mos 11/06/2018   Influenza-Unspecified 11/06/2018   PFIZER(Purple Top)SARS-COV-2 Vaccination 11/28/2019, 12/20/2019, 09/03/2020   Pneumococcal Conjugate-13 11/28/2018   Pneumococcal Polysaccharide-23 04/27/2020   Td 05/28/2018   Tdap 08/29/2007   Zoster Recombinant(Shingrix) 03/06/2018, 06/18/2018   Zoster, Live 04/08/2013   Zoster, Unspecified 06/18/2018   We updated and reviewed the patient's past history in detail and it is documented below. Allergies: Patient has No Known Allergies. Past Medical History Patient  has a past medical history of Osteoarthritis, Osteopenia, Osteoporosis, Vitamin D deficiency, and Xerosis cutis. Past Surgical History Patient  has no past surgical history on file. Family History: Patient family history includes Arthritis in her mother; Hearing loss in her father and mother; Hypertension in her father and mother; Osteoarthritis in an other family member. Social History:  Patient  reports that she has never smoked. She has never used smokeless tobacco. She reports that she does not currently use alcohol. She reports that she does not use drugs.  Review of Systems: Constitutional: negative for fever or  malaise Ophthalmic: negative for photophobia, double vision or loss of vision Cardiovascular: negative for chest pain, dyspnea on exertion, or new LE swelling Respiratory: negative for SOB or persistent cough Gastrointestinal: negative for abdominal pain, change in bowel habits or melena Genitourinary: negative for dysuria or gross hematuria, no abnormal uterine bleeding or disharge Musculoskeletal: negative for new gait disturbance or muscular weakness Integumentary: negative for new or persistent rashes, no breast lumps Neurological: negative for TIA or stroke symptoms Psychiatric: negative for SI or delusions Allergic/Immunologic: negative for hives  Patient Care Team    Relationship Specialty Notifications Start End  Willow Ora, MD PCP - General Family Medicine  11/27/18   Charna Elizabeth, MD Consulting Physician Gastroenterology  11/27/18   Janet Berlin, MD Consulting Physician Ophthalmology  11/27/18   Juluis Pitch, DDS  Dentistry  11/27/18   Samson Frederic, MD Consulting Physician Orthopedic Surgery  07/23/19     Objective  Vitals: BP 138/76   Pulse 68   Temp 98 F (36.7 C)   Ht 5\' 1"  (1.549 m)   Wt 132 lb 12.8 oz (60.2 kg)   SpO2 98%   BMI 25.09 kg/m  General:  Well developed, well nourished, no acute distress  Psych:  Alert and orientedx3,normal mood and  affect HEENT:  Normocephalic, atraumatic, non-icteric sclera,  right lower lashes with discharge and 2 small healing red nodules on external lower lid, supple neck without adenopathy, mass or thyromegaly Cardiovascular:  Normal S1, S2, RRR without gallop, rub or murmur Respiratory:  Good breath sounds bilaterally, CTAB with normal respiratory effort Gastrointestinal: normal bowel sounds, soft, non-tender, no noted masses. No HSM MSK: extremities without edema, joints without erythema or swelling, left hip ttp at bursa, nl rom and gait Skin: several skin tags Neurologic:    Mental status is normal.  Gross motor and  sensory exams are normal.  No tremor  GR Trochanteric Bursa steroid injection  Procedure Note   Pre-operative Diagnosis: left hip bursitis   Post-operative Diagnosis: same   Indications: pain   Anesthesia: cold spray   Procedure Details    Verbal consent was obtained for the procedure. Universal time out done. The point of maximum tenderness was identified and marked over the hip bursa. The skin prepped with alcohol and cold spray used for anesthesia. A needle was advanced into the bursa and the steroid/lido (20mg  kenalog: 1.57ml lidocaine w/o epi) was administered easily.    Complications:  None; patient tolerated the procedure well.  Commons side effects, risks, benefits, and alternatives for medications and treatment plan prescribed today were discussed, and the patient expressed understanding of the given instructions. Patient is instructed to call or message via MyChart if he/she has any questions or concerns regarding our treatment plan. No barriers to understanding were identified. We discussed Red Flag symptoms and signs in detail. Patient expressed understanding regarding what to do in case of urgent or emergency type symptoms.  Medication list was reconciled, printed and provided to the patient in AVS. Patient instructions and summary information was reviewed with the patient as documented in the AVS. This note was prepared with assistance of Dragon voice recognition software. Occasional wrong-word or sound-a-like substitutions may have occurred due to the inherent limitations of voice recognition software

## 2023-08-14 NOTE — Patient Instructions (Addendum)
Please return in 12 months for your annual complete physical; please come fasting.   I will release your lab results to you on your MyChart account with further instructions. You may see the results before I do, but when I review them I will send you a message with my report or have my assistant call you if things need to be discussed. Please reply to my message with any questions. Thank you!   If you have any questions or concerns, please don't hesitate to send me a message via MyChart or call the office at [phone]. Thank you for visiting with Nicole Petty today! It's our pleasure caring for you.   Hip Bursitis  Hip bursitis is the swelling of one or more of the fluid-filled sacs (bursae) in the hip joint. The hip bursae absorb shocks and prevent bones from rubbing against each other. If a bursa becomes irritated, it can fill with extra fluid and become inflamed. Hip bursitis can cause mild to moderate pain, and symptoms often come and go over time. What are the causes? This condition results from increased friction between the hip bones and the tendons around the hip joint. This condition can happen if you: Overuse your hip muscles. Injure your hip. Have weak buttocks muscles. Have bone spurs. Have an infection. In some cases, the cause may not be known. What increases the risk? You are more likely to develop this condition if: You injured your hip previously or had hip surgery. You have a medical condition, such as arthritis, gout, diabetes, or thyroid disease. You have spine problems. You have one leg that is shorter than the other. You participate in athletic activities that include repetitive motion, like running. You participate in sports where there is a risk of injury or falling, such as football, martial arts, or skiing. What are the signs or symptoms? Symptoms may come and go, and they often include: Pain in the hip or groin area. Pain may get worse with movement. Tenderness and  swelling of the hip. In rare cases, the bursa may become infected. If this happens, you may get a fever, as well as warmth and redness in the hip area. How is this diagnosed? This condition may be diagnosed based on: Your symptoms. Your medical history. A physical exam. Imaging tests, such as: X-rays to check your bones. MRI or ultrasound to check your tendons and muscles. Bone scan. How is this treated? This condition is treated by resting, icing, applying pressure (compression), and raising (elevating) the injured area. This is called RICE treatment. In some cases, RICE treatment may not be enough to make your symptoms go away. Treatment may also include: Using crutches, a cane, or a walker to decrease the strain on your hip. Taking medicine to help with swelling and pain. Getting a shot of cortisone medicine near the affected area to reduce swelling and pain. Taking antibiotic medicines if there is an infection. Draining fluid out of the bursa to help relieve swelling and pain. Having surgery to remove a damaged or infected bursa. This is rare. Long-term treatment may include: Physical therapy exercises for strength and flexibility. Identifying the cause of your bursitis to prevent future episodes. Lifestyle changes, such as weight loss, to reduce the strain on the hip. Follow these instructions at home: Managing pain, stiffness, and swelling     If directed, put ice on the affected area. To do this: Put ice in a plastic bag. Place a towel between your skin and the bag. Leave the  ice on for 20 minutes, 2-3 times a day. Remove the ice if your skin turns bright red. This is very important. If you cannot feel pain, heat, or cold, you have a greater risk of damage to the area. Elevate your hip as much as you can without feeling pain. To do this, put a pillow under your hips while you lie down. If directed, apply heat to the affected area as often as told by your health care  provider. Use the heat source that your health care provider recommends, such as a moist heat pack or a heating pad. Place a towel between your skin and the heat source. Leave the heat on for 20-30 minutes. Remove the heat if your skin turns bright red. This is especially important if you are unable to feel pain, heat, or cold. You may have a greater risk of getting burned. Activity Do not use your hip to support your body weight until your health care provider says that you can. Use crutches, a cane, or a walker as told by your health care provider. If the affected leg is one that you use to drive, ask your health care provider if it is safe to drive. Rest and protect your hip as much as possible until your pain and swelling get better. Return to your normal activities as told by your health care provider. Ask your health care provider what activities are safe for you. Do exercises as told by your health care provider. General instructions Take over-the-counter and prescription medicines only as told by your health care provider. Gently massage and stretch your injured area as often as is comfortable. Wear compression wraps only as told by your health care provider. If one of your legs is shorter than the other, get fitted for a shoe insert or orthotic. Your health care provider or physical therapist can tell you where to find these items and what size you need. Maintain a healthy weight. Follow instructions from your health care provider for weight control. These may include dietary restrictions. Keep all follow-up visits. This is important. How is this prevented? Exercise regularly or as told by your health care provider. Wear supportive footwear that is appropriate for your sport and daily activities. Warm up and stretch before being active. Cool down and stretch after being active. Take breaks regularly from repetitive activity. If an activity irritates your hip or causes pain, avoid the  activity as much as possible. Avoid sitting down for long periods at a time. Where to find more information American Academy of Orthopa[employer] Surgeons: orthoinfo.aaos.org Contact a health care provider if: You have a fever. You develop new symptoms. You have trouble walking or doing everyday activities. You have pain that gets worse or does not get better with medicine. You develop red skin or a feeling of warmth in your hip area. Get help right away if: You cannot move your hip. You have severe pain. You cannot control the muscles in your feet. Summary Hip bursitis is the swelling of one or more of the fluid-filled sacs (bursae) in the hip joint. Hip bursitis can cause hip or groin pain, and symptoms often come and go over time. This condition is often treated by resting, icing, applying pressure (compression), and raising (elevating) the injured area. Other treatments may be needed. Hip Bursitis Rehab Ask your health care provider which exercises are safe for you. Do exercises exactly as told by your health care provider and adjust them as directed. It is normal to  feel mild stretching, pulling, tightness, or discomfort as you do these exercises. Stop right away if you feel sudden pain or your pain gets worse. Do not begin these exercises until told by your health care provider. Stretching exercise This exercise warms up your muscles and joints and improves the movement and flexibility of your hip. This exercise also helps to relieve pain and stiffness. Iliotibial band stretch An iliotibial band is a strong band of muscle tissue that runs from the outer side of your hip to the outer side of your thigh and knee. Lie on your side with your left / right leg in the top position. Bend your left / right knee and grab your ankle. Stretch out your bottom arm to help you balance. Slowly bring your knee back so your thigh is slightly behind your body. Slowly lower your knee toward the floor until  you feel a gentle stretch on the outside of your left / right thigh. If you do not feel a stretch and your knee will not lower more toward the floor, place the heel of your other foot on top of your knee and pull your knee down toward the floor with your foot. Hold this position for __________ seconds. Slowly return to the starting position. Repeat __________ times. Complete this exercise __________ times a day. Strengthening exercises These exercises build strength and endurance in your hip and pelvis. Endurance is the ability to use your muscles for a long time, even after they get tired. Bridge This exercise strengthens the muscles that move your thigh backward (hip extensors). Lie on your back on a firm surface with your knees bent and your feet flat on the floor. Tighten your buttocks muscles and lift your buttocks off the floor until your trunk is level with your thighs. Do not arch your back. You should feel the muscles working in your buttocks and the back of your thighs. If you do not feel these muscles, slide your feet 1-2 inches (2.5-5 cm) farther away from your buttocks. If this exercise is too easy, try doing it with your arms crossed over your chest. Hold this position for __________ seconds. Slowly lower your hips to the starting position. Let your muscles relax completely after each repetition. Repeat __________ times. Complete this exercise __________ times a day. Squats This exercise strengthens the muscles in front of your thigh and knee (quadriceps). Stand in front of a table, with your feet and knees pointing straight ahead. You may rest your hands on the table for balance but not for support. Slowly bend your knees and lower your hips like you are going to sit in a chair. Keep your weight over your heels, not over your toes. Keep your lower legs upright so they are parallel with the table legs. Do not let your hips go lower than your knees. Do not bend lower than told by  your health care provider. If your hip pain increases, do not bend as low. Hold the squat position for __________ seconds. Slowly push with your legs to return to standing. Do not use your hands to pull yourself to standing. Repeat __________ times. Complete this exercise __________ times a day. Hip hike  Stand sideways on a bottom step. Stand on your left / right leg with your other foot unsupported next to the step. You can hold on to the railing or wall for balance if needed. Keep your knees straight and your torso square. Then lift your left / right hip up toward  the ceiling. Hold this position for __________ seconds. Slowly let your left / right hip lower toward the floor, past the starting position. Your foot should get closer to the floor. Do not lean or bend your knees. Repeat __________ times. Complete this exercise __________ times a day. Single leg stand This exercise increases your balance. Without shoes, stand near a railing or in a doorway. You may hold on to the railing or door frame as needed for balance. Squeeze your left / right buttock muscles, then lift up your other foot. Do not let your left / right hip push out to the side. It is helpful to stand in front of a mirror for this exercise so you can watch your hip. Hold this position for __________ seconds. Repeat __________ times. Complete this exercise __________ times a day. This information is not intended to replace advice given to you by your health care provider. Make sure you discuss any questions you have with your health care provider. Document Revised: 09/29/2021 Document Reviewed: 09/29/2021 Elsevier Patient Education  2024 [employer].  This information is not intended to replace advice given to you by your health care provider. Make sure you discuss any questions you have with your health care provider. Document Revised: 10/12/2021 Document Reviewed: 10/12/2021 Elsevier Patient Education  2024 [employer].

## 2023-08-16 NOTE — Progress Notes (Signed)
Labs reviewed.  The 10-year ASCVD risk score (Arnett DK, et al., 2019) is: 10.3%- offered statin   Values used to calculate the score:     Age: 72 years     Sex: Female     Is Non-Hispanic African American: No     Diabetic: No     Tobacco smoker: No     Systolic Blood Pressure: 138 mmHg     Is BP treated: No     HDL Cholesterol: 70.9 mg/dL     Total Cholesterol: 192 mg/dL

## 2023-08-22 ENCOUNTER — Encounter: Payer: Self-pay | Admitting: Family Medicine

## 2023-10-05 ENCOUNTER — Ambulatory Visit: Payer: Medicare Other | Admitting: Physical Therapy

## 2023-10-12 ENCOUNTER — Ambulatory Visit: Payer: Medicare Other | Admitting: Physical Therapy

## 2024-04-23 ENCOUNTER — Ambulatory Visit: Payer: Medicare Other

## 2024-05-20 LAB — HM MAMMOGRAPHY

## 2024-05-23 ENCOUNTER — Encounter: Payer: Self-pay | Admitting: Family Medicine

## 2024-06-06 ENCOUNTER — Other Ambulatory Visit: Payer: Self-pay

## 2024-06-07 LAB — SURGICAL PATHOLOGY

## 2024-08-15 ENCOUNTER — Ambulatory Visit (INDEPENDENT_AMBULATORY_CARE_PROVIDER_SITE_OTHER): Payer: Medicare Other | Admitting: Family Medicine

## 2024-08-15 ENCOUNTER — Encounter: Payer: Self-pay | Admitting: Family Medicine

## 2024-08-15 ENCOUNTER — Ambulatory Visit: Payer: Self-pay | Admitting: Family Medicine

## 2024-08-15 VITALS — BP 118/60 | HR 63 | Temp 97.7°F | Ht 61.0 in | Wt 130.0 lb

## 2024-08-15 DIAGNOSIS — M19042 Primary osteoarthritis, left hand: Secondary | ICD-10-CM

## 2024-08-15 DIAGNOSIS — M810 Age-related osteoporosis without current pathological fracture: Secondary | ICD-10-CM

## 2024-08-15 DIAGNOSIS — J301 Allergic rhinitis due to pollen: Secondary | ICD-10-CM

## 2024-08-15 DIAGNOSIS — E782 Mixed hyperlipidemia: Secondary | ICD-10-CM | POA: Diagnosis not present

## 2024-08-15 DIAGNOSIS — Z0000 Encounter for general adult medical examination without abnormal findings: Secondary | ICD-10-CM | POA: Diagnosis not present

## 2024-08-15 DIAGNOSIS — Z780 Asymptomatic menopausal state: Secondary | ICD-10-CM

## 2024-08-15 DIAGNOSIS — L608 Other nail disorders: Secondary | ICD-10-CM

## 2024-08-15 DIAGNOSIS — Z23 Encounter for immunization: Secondary | ICD-10-CM | POA: Diagnosis not present

## 2024-08-15 DIAGNOSIS — M19041 Primary osteoarthritis, right hand: Secondary | ICD-10-CM

## 2024-08-15 LAB — CBC WITH DIFFERENTIAL/PLATELET
Basophils Absolute: 0 K/uL (ref 0.0–0.1)
Basophils Relative: 0.4 % (ref 0.0–3.0)
Eosinophils Absolute: 0.1 K/uL (ref 0.0–0.7)
Eosinophils Relative: 2.5 % (ref 0.0–5.0)
HCT: 41 % (ref 36.0–46.0)
Hemoglobin: 13.5 g/dL (ref 12.0–15.0)
Lymphocytes Relative: 19.5 % (ref 12.0–46.0)
Lymphs Abs: 0.6 K/uL — ABNORMAL LOW (ref 0.7–4.0)
MCHC: 33 g/dL (ref 30.0–36.0)
MCV: 92.8 fl (ref 78.0–100.0)
Monocytes Absolute: 0.2 K/uL (ref 0.1–1.0)
Monocytes Relative: 7.4 % (ref 3.0–12.0)
Neutro Abs: 2.1 K/uL (ref 1.4–7.7)
Neutrophils Relative %: 70.2 % (ref 43.0–77.0)
Platelets: 229 K/uL (ref 150.0–400.0)
RBC: 4.42 Mil/uL (ref 3.87–5.11)
RDW: 13.8 % (ref 11.5–15.5)
WBC: 2.9 K/uL — ABNORMAL LOW (ref 4.0–10.5)

## 2024-08-15 LAB — COMPREHENSIVE METABOLIC PANEL WITH GFR
ALT: 14 U/L (ref 0–35)
AST: 20 U/L (ref 0–37)
Albumin: 4.4 g/dL (ref 3.5–5.2)
Alkaline Phosphatase: 70 U/L (ref 39–117)
BUN: 19 mg/dL (ref 6–23)
CO2: 27 meq/L (ref 19–32)
Calcium: 9.1 mg/dL (ref 8.4–10.5)
Chloride: 106 meq/L (ref 96–112)
Creatinine, Ser: 0.77 mg/dL (ref 0.40–1.20)
GFR: 77.42 mL/min (ref >60.00)
Glucose, Bld: 94 mg/dL (ref 70–99)
Potassium: 3.6 meq/L (ref 3.5–5.1)
Sodium: 143 meq/L (ref 135–145)
Total Bilirubin: 0.6 mg/dL (ref 0.2–1.2)
Total Protein: 7 g/dL (ref 6.0–8.3)

## 2024-08-15 LAB — LIPID PANEL
Cholesterol: 215 mg/dL — ABNORMAL HIGH (ref 0–200)
HDL: 86.5 mg/dL (ref >39.00)
LDL Cholesterol: 121 mg/dL — ABNORMAL HIGH (ref 0–99)
NonHDL: 128.87
Total CHOL/HDL Ratio: 2
Triglycerides: 41 mg/dL (ref 0.0–149.0)
VLDL: 8.2 mg/dL (ref 0.0–40.0)

## 2024-08-15 LAB — VITAMIN D 25 HYDROXY (VIT D DEFICIENCY, FRACTURES): VITD: 31.56 ng/mL (ref 30.00–100.00)

## 2024-08-15 LAB — TSH: TSH: 1.71 u[IU]/mL (ref 0.35–5.50)

## 2024-08-15 NOTE — Patient Instructions (Addendum)
 Please return in 12 months for your annual complete physical; please come fasting.   I will release your lab results to you on your MyChart account with further instructions. You may see the results before I do, but when I review them I will send you a message with my report or have my assistant call you if things need to be discussed. Please reply to my message with any questions. Thank you!   If you have any questions or concerns, please don't hesitate to send me a message via MyChart or call the office at [phone]. Thank you for visiting with us  today! It's our pleasure caring for you.    VISIT SUMMARY: During your visit, we discussed your cholesterol management, occasional episodes of hypoglycemia-like symptoms, sleep-disordered breathing symptoms, and toenail discoloration and thickening. We reviewed your current health status and made plans for further evaluation and treatment.  YOUR PLAN: -MIXED HYPERLIPIDEMIA: Mixed hyperlipidemia means you have elevated cholesterol levels, which increases your risk for heart and blood vessel problems. We will order blood work to reassess your cholesterol levels and recalculate your cardiovascular risk. If your risk remains elevated, we will start you on statin therapy to lower your cholesterol and reduce your risk of vascular events.  -POSSIBLE HYPOGLYCEMIA: Hypoglycemia refers to low blood sugar levels, which can cause symptoms like shakiness and weakness. To prevent these episodes, we recommend checking your blood sugar levels and eating regular meals and snacks.  -ALLERGIC RHINITIS: Allergic rhinitis is an allergic reaction that causes symptoms like congestion and sneezing, which may contribute to your snoring. We suggest trying Zyrtec or Allegra to manage your allergy symptoms and see if it helps with the snoring.  -ABNORMAL TOENAILS, BILATERAL: Your toenail discoloration and thickening could be due to trauma or a fungal infection. Since there is no  pain, we will monitor the condition. If it persists or worsens, we may refer you to a dermatologist or podiatrist for further evaluation.  INSTRUCTIONS: Please follow up with the recommended blood work to reassess your cholesterol levels. Monitor your blood sugar levels and maintain regular meals and snacks to prevent hypoglycemia. Try Zyrtec or Allegra for your allergy symptoms and observe any changes in your snoring. If your toenail condition persists or worsens, we will consider a referral to a specialist.                      Contains text generated by [employer].                                 Contains text generated by [employer].

## 2024-08-15 NOTE — Progress Notes (Signed)
 Labs reviewed.  The 10-year ASCVD risk score (Arnett DK, et al., 2019) is: 8.6%   Values used to calculate the score:     Age: 73 years     Clincally relevant sex: Female     Is Non-Hispanic African American: No     Diabetic: No     Tobacco smoker: No     Systolic Blood Pressure: 118 mmHg     Is BP treated: No     HDL Cholesterol: 86.5 mg/dL     Total Cholesterol: 215 mg/dL  Dear Nicole Petty, Thank you for allowing me to care for you at your recent office visit.  I wanted to let you know that I have reviewed your lab test results and am happy to report that they look good.  I am ok with your cholesterol levels where they are. Next year, I will order a more broad lipid panel to assess further. I do not recommend a statin at this time.  Take care!  Sincerely, Dr. Jodie

## 2024-08-15 NOTE — Progress Notes (Signed)
 Subjective  Chief Complaint  Patient presents with   Annual Exam    Pt here for Annual Exam and is currently fasting     HPI: Nicole Petty is a 73 y.o. female who presents to [facility] [place] today for a Female Wellness Visit. She also has the concerns and/or needs as listed above in the chief complaint. These will be addressed in addition to the Health Maintenance Visit.   Wellness Visit: annual visit with health maintenance review and exam  HM: mammo current. Healthy lifestyle. CRC screen current. Due dexa, see below. Flu shot today.   Chronic disease f/u and/or acute problem visit: (deemed necessary to be done in addition to the wellness visit): Discussed the use of AI scribe software for clinical note transcription with the patient, who gave verbal consent to proceed.  History of Present Illness Nicole Petty is a 73 year old female who presents for a routine follow-up and evaluation of cholesterol management.  Hyperlipidemia evaluation - Elevated cholesterol levels with a calculated vascular event risk of approximately 8.6% last year - Not currently taking any cholesterol-lowering medication, we discussed indication to lower CVD risk.  Lab Results  Component Value Date   CHOL 192 08/14/2023   HDL 70.90 08/14/2023   LDLCALC 112 (H) 08/14/2023   TRIG 44.0 08/14/2023   CHOLHDL 3 08/14/2023   The 10-year ASCVD risk score (Arnett DK, et al., 2019) is: 8.6%   Values used to calculate the score:     Age: 12 years     Clincally relevant sex: Female     Is Non-Hispanic African American: No     Diabetic: No     Tobacco smoker: No     Systolic Blood Pressure: 118 mmHg     Is BP treated: No     HDL Cholesterol: 70.9 mg/dL     Total Cholesterol: 192 mg/dL   Episodes of hypoglycemia-like symptoms - Occasional episodes of shaking and weakness, occurring twice in the past year - Symptoms resolve with food intake, such as consuming  crackers  Sleep-disordered breathing symptoms - Snoring present for the past two to three months - Brief episodes of apnea, particularly when supine - No excessive daytime sleepiness - Feels generally rested upon waking - endorses nasal congestion and sneezing  Toenail discoloration and thickening - Purple discoloration and thickening of toenails present for several months to two years - Possible history of past injury to the toenail - No associated pain - Concerned about cosmetic appearance  Osteoporosis: - Drug holiday from Fosamax for the last year.  Due for bone density.  No fractures. - Exercises, takes calcium and vitamin D     Assessment  1. Annual physical exam   2. Age-related osteoporosis without current pathological fracture   3. Primary osteoarthritis of both hands   4. Asymptomatic menopausal state   5. Mixed hyperlipidemia   6. Need for influenza vaccination   7. Toenail deformity   8. Seasonal allergic rhinitis due to pollen      Plan  Female Wellness Visit: Age appropriate Health Maintenance and Prevention measures were discussed with patient. Included topics are cancer screening recommendations, ways to keep healthy (see AVS) including dietary and exercise recommendations, regular eye and dental care, use of seat belts, and avoidance of moderate alcohol use and tobacco use.  Screens are current BMI: discussed patient's BMI and encouraged positive lifestyle modifications to help get to or maintain a target BMI. HM needs and immunizations  were addressed and ordered. See below for orders. See HM and immunization section for updates.  Flu shot today Routine labs and screening tests ordered including cmp, cbc and lipids where appropriate. Discussed recommendations regarding Vit D and calcium supplementation (see AVS)  Chronic disease management visit and/or acute problem visit: Assessment and Plan Assessment & Plan Mixed hyperlipidemia Cholesterol levels are  generally good, but age and other factors increase risk for vascular events. Previous risk calculation was around 10%, indicating intermediate risk. Discussed potential benefits of statin therapy for cardiovascular protection if risk remains elevated. Statins may have anti-inflammatory effects that protect against vascular events. She expressed concern about taking unnecessary medication. - Order blood work to reassess cholesterol levels and recalculate cardiovascular risk. - If risk remains elevated, initiate statin therapy to lower cholesterol and reduce vascular risk.  Possible hypoglycemia Occasional episodes of shakiness and weakness, possibly due to low blood sugar, especially when meals are skipped. Occurred twice in the past year. - Check blood sugar levels. - Advise regular meals and snacks to prevent low blood sugar episodes.  Allergic rhinitis Intermittent symptoms of congestion and sneezing, possibly contributing to snoring. Symptoms are consistent with allergic rhinitis. - Recommend trial of Zyrtec or Allegra to manage allergy symptoms and assess impact on snoring. - Return if he has symptoms of sleep apnea, daytime fatigue  Abnormal toenails, bilateral Bilateral toenail discoloration and thickening, possibly due to trauma or fungal infection. No pain reported. Duration of several months to years. Differential includes trauma-induced nail matrix damage and possible fungal infection. - refer to podiatrist for further assessment.   Osteoporosis - Order bone density from [place].  Can consider starting medication again if needed.  Could use biphosphonate again or Prolia    Follow up: 1 year for complete physical Orders Placed This Encounter  Procedures   DG Bone Density   Flu vaccine HIGH DOSE PF(Fluzone Trivalent)   VITAMIN D  25 Hydroxy (Vit-D Deficiency, Fractures)   CBC with Differential/Platelet   Comprehensive metabolic panel with GFR   Lipid panel   TSH   No orders of  the defined types were placed in this encounter.     Body mass index is 24.56 kg/m. Wt Readings from Last 3 Encounters:  08/15/24 130 lb (59 kg)  08/14/23 132 lb 12.8 oz (60.2 kg)  04/18/23 132 lb 9.6 oz (60.1 kg)     Patient Active Problem List   Diagnosis Date Noted   Osteoarthritis of both hands 11/27/2018    1st mcps    Osteoporosis 09/29/2016    Dexa 10/11/2022, [facility]:  t = -2.8 at lumbar spine; new technique so can't compare to 2021 data.  Rec drug holiday and recheck in 2 years.drug holiday actually started 10/2022 DEXA: 08/2020; lowest T = -2.6 spine. Osteoporosis persists on boniva  since 2017. rec continuation through 2022 and recheck DEXA in 2023. Drug holiday starting 10/2022.  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  Osteopenia at multiple sites (improved on boniva ).. Follow up in 2 years. R femur neck T-Score: -2.10 L femur T-Score: -1.90 R Total femur T-Score: -1.60 L Total femur T-Score: -1.90 AP Total Spine T-Score: -2.40    Health Maintenance  Topic Date Due   Medicare Annual Wellness (AWV)  04/17/2024   COVID-19 Vaccine (4 - 2025-26 season) 08/31/2024 (Originally 07/01/2024)   DEXA SCAN  10/11/2024   Mammogram  05/20/2025   Fecal DNA (Cologuard)  09/06/2025   DTaP/Tdap/Td (3 - Td or Tdap) 05/28/2028   Pneumococcal Vaccine: 50+ Years  Completed   Influenza Vaccine  Completed   Hepatitis C Screening  Completed   Zoster Vaccines- Shingrix  Completed   Meningococcal B Vaccine  Aged Out   Immunization History  Administered Date(s) Administered   Fluad Quad(high Dose 65+) 07/23/2019, 07/31/2020, 07/12/2021, 07/18/2022   Fluad Trivalent(High Dose 65+) 07/21/2023   INFLUENZA, HIGH DOSE SEASONAL PF 08/15/2024   Influenza Split 09/17/2015, 06/25/2016   Influenza, Quadrivalent, Recombinant, Inj, Pf 10/03/2017   Influenza,inj,Quad PF,6+ Mos 11/06/2018    Influenza-Unspecified 11/06/2018   PFIZER(Purple Top)SARS-COV-2 Vaccination 11/28/2019, 12/20/2019, 09/03/2020   Pneumococcal Conjugate-13 11/28/2018   Pneumococcal Polysaccharide-23 04/27/2020   Td 05/28/2018   Tdap 08/29/2007   Zoster Recombinant(Shingrix) 03/06/2018, 06/18/2018   Zoster, Live 04/08/2013   Zoster, Unspecified 06/18/2018   We updated and reviewed the patient's past history in detail and it is documented below. Allergies: Patient has no known allergies. Past Medical History Patient  has a past medical history of Osteoarthritis, Osteopenia, Osteoporosis, Vitamin D  deficiency, and Xerosis cutis. Past Surgical History Patient  has no past surgical history on file. Family History: Patient family history includes Arthritis in her mother; Hearing loss in her father and mother; Hypertension in her father and mother; Osteoarthritis in an other family member. Social History:  Patient  reports that she has never smoked. She has never used smokeless tobacco. She reports that she does not currently use alcohol. She reports that she does not use drugs.  Review of Systems: Constitutional: negative for fever or malaise Ophthalmic: negative for photophobia, double vision or loss of vision Cardiovascular: negative for chest pain, dyspnea on exertion, or new LE swelling Respiratory: negative for SOB or persistent cough Gastrointestinal: negative for abdominal pain, change in bowel habits or melena Genitourinary: negative for dysuria or gross hematuria, no abnormal uterine bleeding or disharge Musculoskeletal: negative for new gait disturbance or muscular weakness Integumentary: negative for new or persistent rashes, no breast lumps Neurological: negative for TIA or stroke symptoms Psychiatric: negative for SI or delusions Allergic/Immunologic: negative for hives  Patient Care Team    Relationship Specialty Notifications Start End  Jodie Lavern CROME, MD PCP - General Family Medicine   11/27/18   Kristie Lamprey, MD Consulting Physician Gastroenterology  11/27/18   Patrcia Sharper, MD Consulting Physician Ophthalmology  11/27/18   Patrcia Agent, DDS  Dentistry  11/27/18   Fidel Rogue, MD Consulting Physician Orthopedic Surgery  07/23/19     Objective  Vitals: BP 118/60   Pulse 63   Temp 97.7 F (36.5 C)   Ht 5' 1 (1.549 m)   Wt 130 lb (59 kg)   SpO2 99%   BMI 24.56 kg/m  General:  Well developed, well nourished, no acute distress  Psych:  Alert and orientedx3,normal mood and affect HEENT:  Normocephalic, atraumatic, non-icteric sclera,  supple neck without adenopathy, mass or thyromegaly Cardiovascular:  Normal S1, S2, RRR without gallop, rub or murmur Respiratory:  Good breath sounds bilaterally, CTAB with normal respiratory effort Gastrointestinal: normal bowel sounds, soft, non-tender, no noted masses. No HSM MSK: extremities without edema, joints without erythema or swelling Single toenail on each foot with hyperpigmentation beneath nail.  Also with some dystrophic nails Neurologic:    Mental status is normal.  Gross motor and sensory exams are normal.  No tremor  Commons side effects, risks, benefits, and alternatives for medications and treatment plan prescribed today were discussed, and the patient  expressed understanding of the given instructions. Patient is instructed to call or message via MyChart if he/she has any questions or concerns regarding our treatment plan. No barriers to understanding were identified. We discussed Red Flag symptoms and signs in detail. Patient expressed understanding regarding what to do in case of urgent or emergency type symptoms.  Medication list was reconciled, printed and provided to the patient in AVS. Patient instructions and summary information was reviewed with the patient as documented in the AVS. This note was prepared with assistance of Dragon voice recognition software. Occasional wrong-word or sound-a-like substitutions  may have occurred due to the inherent limitations of voice recognition software

## 2024-08-19 ENCOUNTER — Ambulatory Visit (INDEPENDENT_AMBULATORY_CARE_PROVIDER_SITE_OTHER): Admitting: Podiatry

## 2024-08-19 DIAGNOSIS — M1990 Unspecified osteoarthritis, unspecified site: Secondary | ICD-10-CM | POA: Insufficient documentation

## 2024-08-19 DIAGNOSIS — B351 Tinea unguium: Secondary | ICD-10-CM | POA: Diagnosis not present

## 2024-08-19 DIAGNOSIS — R7689 Other specified abnormal immunological findings in serum: Secondary | ICD-10-CM | POA: Insufficient documentation

## 2024-08-19 NOTE — Progress Notes (Signed)
"  °  Subjective:  Patient ID: Nicole Petty, female    DOB: Dec 22, 1950,  MRN: [id number]  Chief Complaint  Patient presents with   Nail Problem    Nail discoloration     Discussed the use of AI scribe software for clinical note transcription with the patient, who gave verbal consent to proceed.  History of Present Illness Nicole Petty is a 73 year old female who presents with discolored and thickened toenails. She was referred by her family doctor for evaluation of her toenail issues.  She has experienced purple discoloration of her toenails for at least two years, with some nails affected for a couple of months and others longer. One toenail appears 'cut off' and another is 'purple too'. One toenail is thickened at the end, and another is 'a little loose'. There is no recent injury to the toenails, and she has not received any treatment except for applying toenail polish. She does not take blood thinners. No pain in the toenails.      Objective:  There were no vitals filed for this visit.  Physical Exam General: AAO x3, NAD  Dermatological: Left second digit toenail is loose.  As pictured below there are some dried blood present underneath the right fourth digit as well as the left second toe.  Once is able to debride this off and remove dried blood present.  There is no extension of hyperpigmentation in the surrounding skin.  There is no drainage or pus.  No erythema or signs of infection otherwise.  Vascular: Dorsalis Pedis artery and Posterior Tibial artery pedal pulses are 2/4 bilateral with immedate capillary fill time. There is no pain with calf compression, swelling, warmth, erythema.   Neruologic: Grossly intact via light touch bilateral.   Musculoskeletal: No tenderness on exam today.         Results    Assessment:   1. Onychomycosis      Plan:  Patient was evaluated and treated and all questions answered.  Assessment and Plan Assessment & Plan Abnormal  toenails with subungual hematoma and possible onychomycosis Chronic toenail discoloration and thickening with subungual hematoma. Differential includes subungual hematoma, onychomycosis, or nail damage. - Obtain toenail samples for culture and biopsy. - Advise Epsom salt soaks to aid in removing dry blood. - Inform her of potential toenail loss due to looseness. - Communicate results within two weeks and discuss further treatment options.   No follow-ups on file.   Nicole Petty Fees DPM      "

## 2024-08-26 ENCOUNTER — Other Ambulatory Visit: Payer: Self-pay | Admitting: Podiatry

## 2024-09-05 ENCOUNTER — Ambulatory Visit: Payer: Self-pay | Admitting: Podiatry

## 2024-09-05 ENCOUNTER — Other Ambulatory Visit: Payer: Self-pay | Admitting: Podiatry

## 2024-09-05 MED ORDER — TRIAMCINOLONE ACETONIDE 0.1 % EX OINT: 1.0000 | TOPICAL_OINTMENT | Freq: Two times a day (BID) | CUTANEOUS | 2 refills | Status: AC

## 2024-11-12 ENCOUNTER — Encounter: Payer: Self-pay | Admitting: Family Medicine

## 2024-11-20 ENCOUNTER — Ambulatory Visit: Admitting: Family

## 2025-08-21 ENCOUNTER — Encounter: Admitting: Family Medicine
